# Patient Record
Sex: Female | Born: 1975 | Race: White | Hispanic: No | Marital: Married | Smoking: Never smoker
Health system: Southern US, Community
[De-identification: ages and names within clinical notes are randomized; demographics above are authoritative.]

## PROBLEM LIST (undated history)

## (undated) DIAGNOSIS — K219 Gastro-esophageal reflux disease without esophagitis: Secondary | ICD-10-CM

## (undated) DIAGNOSIS — J45909 Unspecified asthma, uncomplicated: Secondary | ICD-10-CM

## (undated) DIAGNOSIS — E282 Polycystic ovarian syndrome: Secondary | ICD-10-CM

## (undated) DIAGNOSIS — I10 Essential (primary) hypertension: Secondary | ICD-10-CM

## (undated) DIAGNOSIS — F3281 Premenstrual dysphoric disorder: Secondary | ICD-10-CM

## (undated) DIAGNOSIS — T7840XA Allergy, unspecified, initial encounter: Secondary | ICD-10-CM

## (undated) DIAGNOSIS — F419 Anxiety disorder, unspecified: Secondary | ICD-10-CM

## (undated) HISTORY — DX: Polycystic ovarian syndrome: E28.2

## (undated) HISTORY — DX: Allergy, unspecified, initial encounter: T78.40XA

## (undated) HISTORY — DX: Anxiety disorder, unspecified: F41.9

## (undated) HISTORY — DX: Premenstrual dysphoric disorder: F32.81

## (undated) HISTORY — DX: Unspecified asthma, uncomplicated: J45.909

## (undated) HISTORY — DX: Essential (primary) hypertension: I10

## (undated) HISTORY — DX: Gastro-esophageal reflux disease without esophagitis: K21.9

## (undated) HISTORY — PX: DENTAL SURGERY: SHX609

---

## 2015-10-11 DIAGNOSIS — E282 Polycystic ovarian syndrome: Secondary | ICD-10-CM | POA: Insufficient documentation

## 2015-10-11 DIAGNOSIS — F3281 Premenstrual dysphoric disorder: Secondary | ICD-10-CM | POA: Insufficient documentation

## 2015-10-14 LAB — HM PAP SMEAR

## 2017-07-22 ENCOUNTER — Ambulatory Visit
Admission: RE | Admit: 2017-07-22 | Discharge: 2017-07-22 | Disposition: A | Discharge status: Home / Self Care | Payer: BC Managed Care – PPO | Source: Ambulatory Visit | Attending: Unknown Physician Specialty | Admitting: Unknown Physician Specialty

## 2017-07-22 ENCOUNTER — Encounter: Payer: Self-pay | Admitting: Unknown Physician Specialty

## 2017-07-22 ENCOUNTER — Ambulatory Visit: Payer: BC Managed Care – PPO | Admitting: Unknown Physician Specialty

## 2017-07-22 VITALS — BP 148/97 | HR 90 | Temp 98.6°F | Ht 63.5 in | Wt 222.8 lb

## 2017-07-22 DIAGNOSIS — R05 Cough: Secondary | ICD-10-CM | POA: Insufficient documentation

## 2017-07-22 DIAGNOSIS — F3281 Premenstrual dysphoric disorder: Secondary | ICD-10-CM | POA: Diagnosis not present

## 2017-07-22 DIAGNOSIS — R059 Cough, unspecified: Secondary | ICD-10-CM

## 2017-07-22 DIAGNOSIS — J9811 Atelectasis: Secondary | ICD-10-CM | POA: Diagnosis not present

## 2017-07-22 DIAGNOSIS — J4521 Mild intermittent asthma with (acute) exacerbation: Secondary | ICD-10-CM

## 2017-07-22 DIAGNOSIS — J45909 Unspecified asthma, uncomplicated: Secondary | ICD-10-CM | POA: Insufficient documentation

## 2017-07-22 DIAGNOSIS — K219 Gastro-esophageal reflux disease without esophagitis: Secondary | ICD-10-CM | POA: Insufficient documentation

## 2017-07-22 MED ORDER — ALBUTEROL SULFATE (2.5 MG/3ML) 0.083% IN NEBU
2.5000 mg | INHALATION_SOLUTION | Freq: Once | RESPIRATORY_TRACT | Status: AC
  Administered 2017-07-22: 2.5 mg via RESPIRATORY_TRACT

## 2017-07-22 MED ORDER — ALBUTEROL SULFATE HFA 108 (90 BASE) MCG/ACT IN AERS: 2.0000 | INHALATION_SPRAY | Freq: Four times a day (QID) | RESPIRATORY_TRACT | 1 refills | Status: DC | PRN

## 2017-07-22 MED ORDER — MONTELUKAST SODIUM 10 MG PO TABS: 10.0000 mg | ORAL_TABLET | Freq: Every day | ORAL | 3 refills | Status: DC

## 2017-07-22 MED ORDER — ALBUTEROL SULFATE (2.5 MG/3ML) 0.083% IN NEBU: 2.5000 mg | INHALATION_SOLUTION | Freq: Four times a day (QID) | RESPIRATORY_TRACT | 1 refills | Status: DC | PRN

## 2017-07-22 MED ORDER — OMEPRAZOLE 20 MG PO CPDR: 20.0000 mg | DELAYED_RELEASE_CAPSULE | Freq: Every day | ORAL | 3 refills | Status: DC

## 2017-07-22 MED ORDER — FLUOXETINE HCL 20 MG PO CAPS: 20.0000 mg | ORAL_CAPSULE | Freq: Every day | ORAL | 3 refills | Status: DC

## 2017-07-22 NOTE — Progress Notes (Signed)
BP (!) 148/97 (BP Location: Left Arm, Cuff Size: Large)   Pulse 90   Temp 98.6 F (37 C) (Oral)   Ht 5' 3.5" (1.613 m)   Wt 222 lb 12.8 oz (101.1 kg)   LMP 07/20/2017 (Exact Date)   SpO2 99%   BMI 38.85 kg/m    Subjective:    [name], female    DOB: October 01, 1975, 42 y.o.   MRN: [id number]  HPI: Sydney White is a 42 y.o. female  Chief Complaint  Patient presents with  . Establish Care   Depression Taking Fluoxetine for PMDD.  Tried Celexa but caused nausea.   Depression screen [facility] 2/9 07/22/2017  Decreased Interest 0  Down, Depressed, Hopeless 0  PHQ - 2 Score 0  Altered sleeping 1  Tired, decreased energy 1  Change in appetite 1  Feeling bad or failure about yourself  0  Trouble concentrating 1  Moving slowly or fidgety/restless 0  PHQ-9 Score 4   Asthma History of frequent bronchitis as a child.  Seemed to reappear following pregnancy.  Singulair and Flonase helps "some."  States she has been feeling like she has a flare for the last 6 weeks.  States mucous draining in her throat makes her nauseated.    Cough  This is a new problem. The current episode started more than 1 month ago. The problem has been waxing and waning. The problem occurs constantly. The cough is productive of sputum. Associated symptoms include nasal congestion, rhinorrhea, a sore throat, shortness of breath and wheezing. Pertinent negatives include no chest pain, chills, ear congestion, ear pain, fever, headaches, heartburn, hemoptysis, myalgias, postnasal drip, rash, sweats or weight loss. Exacerbated by: activity. She has tried a beta-agonist inhaler for the symptoms. The treatment provided no relief. Her past medical history is significant for asthma.   Gerd Daily use of Omeprazole.  Needs it both days.  Almost everything gives her heart burn.    Obesity Often concerned about her weight.  Last diet was 2 years before son was born.    Social History   Socioeconomic  History  . Marital status: Married    Spouse name: Not on file  . Number of children: Not on file  . Years of education: Not on file  . Highest education level: Not on file  Social Needs  . Financial resource strain: Not on file  . Food insecurity - worry: Not on file  . Food insecurity - inability: Not on file  . Transportation needs - medical: Not on file  . Transportation needs - non-medical: Not on file  Occupational History  . Not on file  Tobacco Use  . Smoking status: Never Smoker  . Smokeless tobacco: Never Used  Substance and Sexual Activity  . Alcohol use: Yes    Comment: socially  . Drug use: No  . Sexual activity: No  Other Topics Concern  . Not on file  Social History Narrative  . Not on file   Family History  Problem Relation Age of Onset  . Hypertension Mother   . Arthritis Mother   . Asthma Mother   . Hypertension Sister   . Asthma Sister   . Arthritis Sister   . Hypertension Maternal Grandmother   . Kidney disease Maternal Grandmother   . GER disease Maternal Grandmother   . Heart disease Maternal Grandmother   . COPD Maternal Grandmother   . Alcohol abuse Maternal Grandfather   . Emphysema Maternal Grandfather   .  Arthritis Sister   . Asthma Sister     Past Medical History:  Diagnosis Date  . Allergy   . Asthma   . GERD (gastroesophageal reflux disease)   . PCOS (polycystic ovarian syndrome)   . PMDD (premenstrual dysphoric disorder)     Past Surgical History:  Procedure Laterality Date  . CESAREAN SECTION  01/04/2013    Relevant past medical, surgical, family and social history reviewed and updated as indicated. Interim medical history since our last visit reviewed. Allergies and medications reviewed and updated.  Review of Systems  Constitutional: Negative for chills, fever and weight loss.  HENT: Positive for rhinorrhea and sore throat. Negative for ear pain and postnasal drip.   Respiratory: Positive for cough, shortness of  breath and wheezing. Negative for hemoptysis.   Cardiovascular: Negative for chest pain.  Gastrointestinal: Negative for heartburn.  Musculoskeletal: Negative for myalgias.  Skin: Negative for rash.  Neurological: Negative for headaches.    Per HPI unless specifically indicated above     Objective:    BP (!) 148/97 (BP Location: Left Arm, Cuff Size: Large)   Pulse 90   Temp 98.6 F (37 C) (Oral)   Ht 5' 3.5" (1.613 m)   Wt 222 lb 12.8 oz (101.1 kg)   LMP 07/20/2017 (Exact Date)   SpO2 99%   BMI 38.85 kg/m   Wt Readings from Last 3 Encounters:  07/22/17 222 lb 12.8 oz (101.1 kg)    Physical Exam  Constitutional: She is oriented to person, place, and time. She appears well-developed and well-nourished. No distress.  HENT:  Head: Normocephalic and atraumatic.  Eyes: Conjunctivae and lids are normal. Right eye exhibits no discharge. Left eye exhibits no discharge. No scleral icterus.  Neck: Normal range of motion. Neck supple. No JVD present. Carotid bruit is not present.  Cardiovascular: Normal rate, regular rhythm and normal heart sounds.  Pulmonary/Chest: Effort normal. She has wheezes in the right upper field and the left upper field. She has rhonchi in the right lower field.  Abdominal: Normal appearance. There is no splenomegaly or hepatomegaly.  Musculoskeletal: Normal range of motion.  Neurological: She is alert and oriented to person, place, and time.  Skin: Skin is warm, dry and intact. No rash noted. No pallor.  Psychiatric: She has a normal mood and affect. Her behavior is normal. Judgment and thought content normal.    Results for orders placed or performed in visit on 07/22/17  HM PAP SMEAR  Result Value Ref Range   HM Pap smear      negative PAP- lab result in care everywhere from [place]:   Problem List Items Addressed This Visit      Unprioritized   Asthma    Worsening symptoms.  ? Viral related or worsening disease.          Relevant Medications   montelukast (SINGULAIR) 10 MG tablet   albuterol (PROVENTIL HFA;VENTOLIN HFA) 108 (90 Base) MCG/ACT inhaler   GERD (gastroesophageal reflux disease)    Stable, continue present medications.        Relevant Medications   omeprazole (PRILOSEC) 20 MG capsule   PMDD (premenstrual dysphoric disorder)    Stable, continue present medications.        Relevant Medications   FLUoxetine (PROZAC) 20 MG capsule    Other Visit Diagnoses    Cough    -  Primary   F/o atypical.  Rx for Zithromax.  Prednisone 40  mg daily for 5 days   Relevant Orders   DG Chest 2 View       Follow up plan: Return in about 1 day (around 07/23/2017).

## 2017-07-22 NOTE — Assessment & Plan Note (Signed)
Stable, continue present medications.   

## 2017-07-22 NOTE — Addendum Note (Signed)
Addended by: Georgina Peer on: 07/22/2017 04:49 PM   Modules accepted: Orders

## 2017-07-22 NOTE — Assessment & Plan Note (Signed)
Worsening symptoms.  ? Viral related or worsening disease.

## 2017-07-23 ENCOUNTER — Other Ambulatory Visit: Payer: Self-pay | Admitting: Unknown Physician Specialty

## 2017-07-23 ENCOUNTER — Encounter: Payer: Self-pay | Admitting: Unknown Physician Specialty

## 2017-07-23 ENCOUNTER — Ambulatory Visit: Payer: BC Managed Care – PPO | Admitting: Unknown Physician Specialty

## 2017-07-23 VITALS — BP 157/100 | HR 92 | Temp 98.5°F | Wt 222.5 lb

## 2017-07-23 DIAGNOSIS — J0100 Acute maxillary sinusitis, unspecified: Secondary | ICD-10-CM | POA: Diagnosis not present

## 2017-07-23 DIAGNOSIS — J4521 Mild intermittent asthma with (acute) exacerbation: Secondary | ICD-10-CM | POA: Diagnosis not present

## 2017-07-23 MED ORDER — PREDNISONE 20 MG PO TABS: 40.0000 mg | ORAL_TABLET | Freq: Every day | ORAL | 0 refills | Status: DC

## 2017-07-23 MED ORDER — AZITHROMYCIN 250 MG PO TABS: ORAL_TABLET | ORAL | 0 refills | Status: DC

## 2017-07-23 NOTE — Patient Instructions (Signed)
Atelectasis base of lungs.

## 2017-07-23 NOTE — Assessment & Plan Note (Signed)
Improving with present treatment.

## 2017-07-23 NOTE — Progress Notes (Signed)
   BP (!) 157/100   Pulse 92   Temp 98.5 F (36.9 C) (Oral)   Wt 222 lb 8 oz (100.9 kg)   LMP 07/20/2017 (Exact Date)   SpO2 98%   BMI 38.80 kg/m    Subjective:    [name], female    DOB: December 28, 1975, 42 y.o.   MRN: [id number]  HPI: Sydney White is a 42 y.o. female  Chief Complaint  Patient presents with  . Paperwork   Plus f/u on asthma.  States she coughed up a lot of mucous last night and slept better.   She has not started the Prednisone or the Zpack yet.  Chest x-ray showed atelectases but no pneumonia.    Relevant past medical, surgical, family and social history reviewed and updated as indicated. Interim medical history since our last visit reviewed. Allergies and medications reviewed and updated.  Review of Systems  Per HPI unless specifically indicated above     Objective:    BP (!) 157/100   Pulse 92   Temp 98.5 F (36.9 C) (Oral)   Wt 222 lb 8 oz (100.9 kg)   LMP 07/20/2017 (Exact Date)   SpO2 98%   BMI 38.80 kg/m   Wt Readings from Last 3 Encounters:  07/23/17 222 lb 8 oz (100.9 kg)  07/22/17 222 lb 12.8 oz (101.1 kg)    Physical Exam  Constitutional: She is oriented to person, place, and time. She appears well-developed and well-nourished. No distress.  Eyes: Conjunctivae and lids are normal. Right eye exhibits no discharge. Left eye exhibits no discharge. No scleral icterus.  Neck: Normal range of motion. Neck supple. No JVD present. Carotid bruit is not present.  Cardiovascular: Normal rate, regular rhythm and normal heart sounds.  Pulmonary/Chest: Effort normal and breath sounds normal.  Abdominal: Normal appearance. There is no splenomegaly or hepatomegaly.  Musculoskeletal: Normal range of motion.  Neurological: She is alert and oriented to person, place, and time.  Skin: Skin is warm, dry and intact. No rash noted. No pallor.  Psychiatric: She has a normal mood and affect. Her behavior is normal. Judgment and thought  content normal.    Results for orders placed or performed in visit on 07/22/17  HM PAP SMEAR  Result Value Ref Range   HM Pap smear      negative PAP- lab result in care everywhere from [place]:   Problem List Items Addressed This Visit      Unprioritized   Asthma    Improving with present treatment.         Other Visit Diagnoses    Acute non-recurrent maxillary sinusitis    -  Primary   Rx Z pack written.  Hold Prednisone unless starting wheezing.        Form filled out for school  Follow up plan: Return in about 6 weeks (around 09/03/2017). top assess Asthma

## 2017-09-03 ENCOUNTER — Encounter: Payer: Self-pay | Admitting: Unknown Physician Specialty

## 2017-09-03 ENCOUNTER — Ambulatory Visit: Payer: BC Managed Care – PPO | Admitting: Unknown Physician Specialty

## 2017-09-03 VITALS — BP 118/86 | HR 109 | Temp 98.9°F | Ht 63.5 in | Wt 227.4 lb

## 2017-09-03 DIAGNOSIS — J4521 Mild intermittent asthma with (acute) exacerbation: Secondary | ICD-10-CM

## 2017-09-03 DIAGNOSIS — J029 Acute pharyngitis, unspecified: Secondary | ICD-10-CM

## 2017-09-03 DIAGNOSIS — J020 Streptococcal pharyngitis: Secondary | ICD-10-CM

## 2017-09-03 LAB — RAPID STREP SCREEN (MED CTR MEBANE ONLY): STREP GP A AG, IA W/REFLEX: POSITIVE — AB

## 2017-09-03 MED ORDER — BUDESONIDE-FORMOTEROL FUMARATE 160-4.5 MCG/ACT IN AERO: 2.0000 | INHALATION_SPRAY | Freq: Two times a day (BID) | RESPIRATORY_TRACT | 3 refills | Status: DC

## 2017-09-03 MED ORDER — AMOXICILLIN 875 MG PO TABS: 875.0000 mg | ORAL_TABLET | Freq: Two times a day (BID) | ORAL | 0 refills | Status: DC

## 2017-09-03 NOTE — Progress Notes (Signed)
BP 118/86   Pulse (!) 109   Temp 98.9 F (37.2 C) (Oral)   Ht 5' 3.5" (1.613 m)   Wt 227 lb 6.4 oz (103.1 kg)   SpO2 98%   BMI 39.65 kg/m    Subjective:    [name], female    DOB: 09/04/1975, 42 y.o.   MRN: [id number]  HPI: Sydney White is a 42 y.o. female  Chief Complaint  Patient presents with  . URI    pt states she has had congestion, fever, sinus pressure and sore throat since Sunday   . Sore Throat   Sore Throat   This is a new problem. Episode onset: 2 days. The problem has been unchanged. The maximum temperature recorded prior to her arrival was 100.4 - 100.9 F. Associated symptoms include congestion, coughing and drooling. Pertinent negatives include no abdominal pain, diarrhea, ear discharge, ear pain, headaches, hoarse voice, plugged ear sensation, neck pain, shortness of breath, stridor, swollen glands, trouble swallowing or vomiting. Associated symptoms comments: Body aches . She has tried acetaminophen and NSAIDs for the symptoms.   Asthma Feeling well for the past 3 weeks.  Taking Albuterol 2 puffs BID every day.    Relevant past medical, surgical, family and social history reviewed and updated as indicated. Interim medical history since our last visit reviewed. Allergies and medications reviewed and updated.  Review of Systems  HENT: Positive for congestion and drooling. Negative for ear discharge, ear pain, hoarse voice and trouble swallowing.   Respiratory: Positive for cough. Negative for shortness of breath and stridor.   Gastrointestinal: Negative for abdominal pain, diarrhea and vomiting.  Musculoskeletal: Negative for neck pain.  Neurological: Negative for headaches.    Per HPI unless specifically indicated above     Objective:    BP 118/86   Pulse (!) 109   Temp 98.9 F (37.2 C) (Oral)   Ht 5' 3.5" (1.613 m)   Wt 227 lb 6.4 oz (103.1 kg)   SpO2 98%   BMI 39.65 kg/m   Wt Readings from Last 3 Encounters:    09/03/17 227 lb 6.4 oz (103.1 kg)  07/23/17 222 lb 8 oz (100.9 kg)  07/22/17 222 lb 12.8 oz (101.1 kg)    Physical Exam  Constitutional: She is oriented to person, place, and time. She appears well-developed and well-nourished. No distress.  HENT:  Head: Normocephalic and atraumatic.  Right Ear: Tympanic membrane and ear canal normal.  Left Ear: Tympanic membrane and ear canal normal.  Nose: Rhinorrhea present. Right sinus exhibits no maxillary sinus tenderness and no frontal sinus tenderness. Left sinus exhibits no maxillary sinus tenderness and no frontal sinus tenderness.  Mouth/Throat: Mucous membranes are normal. Posterior oropharyngeal erythema present.  Eyes: Conjunctivae and lids are normal. Right eye exhibits no discharge. Left eye exhibits no discharge. No scleral icterus.  Cardiovascular: Normal rate and regular rhythm.  Pulmonary/Chest: Effort normal. No respiratory distress. She has wheezes.  Abdominal: Normal appearance. There is no splenomegaly or hepatomegaly.  Musculoskeletal: Normal range of motion.  Lymphadenopathy:    She has cervical adenopathy.  Neurological: She is alert and oriented to person, place, and time.  Skin: Skin is intact. No rash noted. No pallor.  Psychiatric: She has a normal mood and affect. Her behavior is normal. Judgment and thought content normal.    Results for orders placed or performed in visit on 07/22/17  HM PAP SMEAR  Result Value Ref Range   HM Pap smear  negative PAP- lab result in care everywhere from [place]:   Problem List Items Addressed This Visit      Unprioritized   Asthma    Doing well but taking Albuterol BID.  Start Symbicort 2 puffs BID.  Recheck back in 6 weeks.        Relevant Medications   budesonide-formoterol (SYMBICORT) 160-4.5 MCG/ACT inhaler    Other Visit Diagnoses    Sore throat    -  Primary   Relevant Orders   Rapid Strep Screen (Not at [facility])   Strep pharyngitis        Will start Amoxil 875 mg BID.  NSAIDS.  Salt water gargles       Follow up plan: Return in about 6 weeks (around 10/15/2017).

## 2017-09-03 NOTE — Patient Instructions (Signed)
Strep Throat Strep throat is a bacterial infection of the throat. Your health care provider may call the infection tonsillitis or pharyngitis, depending on whether there is swelling in the tonsils or at the back of the throat. Strep throat is most common during the cold months of the year in children who are 5-40 years of age, but it can happen during any season in people of any age. This infection is spread from person to person (contagious) through coughing, sneezing, or close contact. What are the causes? Strep throat is caused by the bacteria called Streptococcus pyogenes. What increases the risk? This condition is more likely to develop in:  People who spend time in crowded places where the infection can spread easily.  People who have close contact with someone who has strep throat.  What are the signs or symptoms? Symptoms of this condition include:  Fever or chills.  Redness, swelling, or pain in the tonsils or throat.  Pain or difficulty when swallowing.  White or yellow spots on the tonsils or throat.  Swollen, tender glands in the neck or under the jaw.  Red rash all over the body (rare).  How is this diagnosed? This condition is diagnosed by performing a rapid strep test or by taking a swab of your throat (throat culture test). Results from a rapid strep test are usually ready in a few minutes, but throat culture test results are available after one or two days. How is this treated? This condition is treated with antibiotic medicine. Follow these instructions at home: Medicines  Take over-the-counter and prescription medicines only as told by your health care provider.  Take your antibiotic as told by your health care provider. Do not stop taking the antibiotic even if you start to feel better.  Have family members who also have a sore throat or fever tested for strep throat. They may need antibiotics if they have the strep infection. Eating and drinking  Do not  share food, drinking cups, or personal items that could cause the infection to spread to other people.  If swallowing is difficult, try eating soft foods until your sore throat feels better.  Drink enough fluid to keep your urine clear or pale yellow. General instructions  Gargle with a salt-water mixture 3-4 times per day or as needed. To make a salt-water mixture, completely dissolve -1 tsp of salt in 1 cup of warm water.  Make sure that all household members wash their hands well.  Get plenty of rest.  Stay home from school or work until you have been taking antibiotics for 24 hours.  Keep all follow-up visits as told by your health care provider. This is important. Contact a health care provider if:  The glands in your neck continue to get bigger.  You develop a rash, cough, or earache.  You cough up a thick liquid that is green, yellow-brown, or bloody.  You have pain or discomfort that does not get better with medicine.  Your problems seem to be getting worse rather than better.  You have a fever. Get help right away if:  You have new symptoms, such as vomiting, severe headache, stiff or painful neck, chest pain, or shortness of breath.  You have severe throat pain, drooling, or changes in your voice.  You have swelling of the neck, or the skin on the neck becomes red and tender.  You have signs of dehydration, such as fatigue, dry mouth, and decreased urination.  You become increasingly sleepy, or   you cannot wake up completely.  Your joints become red or painful. This information is not intended to replace advice given to you by your health care provider. Make sure you discuss any questions you have with your health care provider. Document Released: 05/18/2000 Document Revised: 01/18/2016 Document Reviewed: 09/13/2014 Elsevier Interactive Patient Education  2018 [employer] Inc.  

## 2017-09-03 NOTE — Assessment & Plan Note (Signed)
Doing well but taking Albuterol BID.  Start Symbicort 2 puffs BID.  Recheck back in 6 weeks.

## 2017-10-02 ENCOUNTER — Ambulatory Visit (INDEPENDENT_AMBULATORY_CARE_PROVIDER_SITE_OTHER): Payer: BC Managed Care – PPO | Admitting: Unknown Physician Specialty

## 2017-10-02 ENCOUNTER — Encounter: Payer: Self-pay | Admitting: Unknown Physician Specialty

## 2017-10-02 DIAGNOSIS — J301 Allergic rhinitis due to pollen: Secondary | ICD-10-CM

## 2017-10-02 DIAGNOSIS — J4521 Mild intermittent asthma with (acute) exacerbation: Secondary | ICD-10-CM | POA: Diagnosis not present

## 2017-10-02 DIAGNOSIS — J309 Allergic rhinitis, unspecified: Secondary | ICD-10-CM | POA: Insufficient documentation

## 2017-10-02 MED ORDER — PREDNISONE 10 MG PO TABS: ORAL_TABLET | ORAL | 0 refills | Status: DC

## 2017-10-02 NOTE — Assessment & Plan Note (Signed)
Pt having a flare in the last 3 days, presumably due to pollen exposure.  Will rx predisone taper.  Continue Singulair.  OK to take Zyrtec BID during a bad flare

## 2017-10-02 NOTE — Progress Notes (Signed)
BP (!) 136/95   Pulse 87   Temp 99.5 F (37.5 C) (Oral)   Ht 5' 3.5" (1.613 m)   Wt 226 lb 12.8 oz (102.9 kg)   SpO2 97%   BMI 39.55 kg/m    Subjective:    [name], female    DOB: [birth year], 42 y.o.   MRN: [id number]  HPI: Sydney White is a 42 y.o. female  Chief Complaint  Patient presents with  . Cough    pt states she has had a cough and a tickle in her throat for the past 3 days  . Nausea    pt states the symbicort is making her nauseated and sick   Cough Pt with persistent cough.  Describes it as a "tickle" in the back of her throat.  States the only thing that helped is [place] D.  However, is keeping her up at night.  Nauseated with her inhalers.  Not taking Symbicort due to nausea.  Rare use of rescue inhaler  She has been to an allergist before and immunotherapy was recommended.    Relevant past medical, surgical, family and social history reviewed and updated as indicated. Interim medical history since our last visit reviewed. Allergies and medications reviewed and updated.  Review of Systems  Per HPI unless specifically indicated above     Objective:    BP (!) 136/95   Pulse 87   Temp 99.5 F (37.5 C) (Oral)   Ht 5' 3.5" (1.613 m)   Wt 226 lb 12.8 oz (102.9 kg)   SpO2 97%   BMI 39.55 kg/m   Wt Readings from Last 3 Encounters:  10/02/17 226 lb 12.8 oz (102.9 kg)  09/03/17 227 lb 6.4 oz (103.1 kg)  07/23/17 222 lb 8 oz (100.9 kg)    Physical Exam  Constitutional: She is oriented to person, place, and time. She appears well-developed and well-nourished. No distress.  HENT:  Head: Normocephalic and atraumatic.  Right Ear: Tympanic membrane and ear canal normal.  Left Ear: Tympanic membrane and ear canal normal.  Nose: Rhinorrhea present. Right sinus exhibits no maxillary sinus tenderness and no frontal sinus tenderness. Left sinus exhibits no maxillary sinus tenderness and no frontal sinus tenderness.  Mouth/Throat:  Mucous membranes are normal. Posterior oropharyngeal erythema present.  Eyes: Conjunctivae and lids are normal. Right eye exhibits no discharge. Left eye exhibits no discharge. No scleral icterus.  Cardiovascular: Normal rate and regular rhythm.  Pulmonary/Chest: Effort normal and breath sounds normal. No respiratory distress.  Abdominal: Normal appearance. There is no splenomegaly or hepatomegaly.  Musculoskeletal: Normal range of motion.  Neurological: She is alert and oriented to person, place, and time.  Skin: Skin is intact. No rash noted. No pallor.  Psychiatric: She has a normal mood and affect. Her behavior is normal. Judgment and thought content normal.      Assessment & Plan:   Problem List Items Addressed This Visit      Unprioritized   Allergic rhinitis    Pt having a flare in the last 3 days, presumably due to pollen exposure.  Will rx predisone taper.  Continue Singulair.  OK to take Zyrtec BID during a bad flare      Asthma    Stable without Symbicort which she seems to be intolerant to.  Continue with Albuterol prn and Singulair      Relevant Medications   predniSONE (DELTASONE) 10 MG tablet       Follow up plan: Return in  about 2 weeks (around 10/16/2017).

## 2017-10-02 NOTE — Assessment & Plan Note (Signed)
Stable without Symbicort which she seems to be intolerant to.  Continue with Albuterol prn and Singulair

## 2017-10-16 ENCOUNTER — Ambulatory Visit: Payer: BC Managed Care – PPO | Admitting: Unknown Physician Specialty

## 2017-10-17 ENCOUNTER — Encounter: Payer: Self-pay | Admitting: Unknown Physician Specialty

## 2018-02-04 ENCOUNTER — Other Ambulatory Visit: Payer: Self-pay | Admitting: Unknown Physician Specialty

## 2018-02-05 NOTE — Telephone Encounter (Signed)
Fluoxetine 20 mg refill Last Refill:ordered: 07/22/17 # 90 1 RF Last OV: 07/22/17- PMDD, 10/02/17 acute PCP: Julian Hy Pharmacy:[facility]  Patient is [facility] patient and has not established with another provider- she may need to be scheduled for follow up with someone before she runs out of medication

## 2018-02-05 NOTE — Telephone Encounter (Signed)
Will give her 30 days, have her see new PCP for more refills.

## 2018-04-29 ENCOUNTER — Encounter: Payer: Self-pay | Admitting: Nurse Practitioner

## 2018-04-30 ENCOUNTER — Ambulatory Visit (INDEPENDENT_AMBULATORY_CARE_PROVIDER_SITE_OTHER): Payer: BC Managed Care – PPO | Admitting: Nurse Practitioner

## 2018-04-30 ENCOUNTER — Encounter: Payer: Self-pay | Admitting: Nurse Practitioner

## 2018-04-30 ENCOUNTER — Other Ambulatory Visit: Payer: Self-pay

## 2018-04-30 VITALS — BP 130/86 | HR 80 | Temp 98.4°F | Ht 62.5 in | Wt 216.0 lb

## 2018-04-30 DIAGNOSIS — F3281 Premenstrual dysphoric disorder: Secondary | ICD-10-CM

## 2018-04-30 DIAGNOSIS — K219 Gastro-esophageal reflux disease without esophagitis: Secondary | ICD-10-CM

## 2018-04-30 DIAGNOSIS — Z0000 Encounter for general adult medical examination without abnormal findings: Secondary | ICD-10-CM | POA: Diagnosis not present

## 2018-04-30 DIAGNOSIS — J4521 Mild intermittent asthma with (acute) exacerbation: Secondary | ICD-10-CM

## 2018-04-30 DIAGNOSIS — J301 Allergic rhinitis due to pollen: Secondary | ICD-10-CM

## 2018-04-30 MED ORDER — MONTELUKAST SODIUM 10 MG PO TABS: 10.0000 mg | ORAL_TABLET | Freq: Every day | ORAL | 3 refills | Status: DC

## 2018-04-30 MED ORDER — FLUOXETINE HCL 20 MG PO CAPS: 20.0000 mg | ORAL_CAPSULE | Freq: Every day | ORAL | 7 refills | Status: DC

## 2018-04-30 MED ORDER — OMEPRAZOLE 20 MG PO CPDR: 20.0000 mg | DELAYED_RELEASE_CAPSULE | Freq: Every day | ORAL | 3 refills | Status: DC

## 2018-04-30 NOTE — Assessment & Plan Note (Signed)
Chronic, stable.  Continue current regimen.  Mag level today, monitor yearly.

## 2018-04-30 NOTE — Progress Notes (Signed)
BP 130/86   Pulse 80   Temp 98.4 F (36.9 C) (Oral)   Ht 5' 2.5" (1.588 m)   Wt 216 lb (98 kg)   SpO2 99%   BMI 38.88 kg/m    Subjective:    [name], female    DOB: Oct 26, 1975, 42 y.o.   MRN: [id number]  HPI: Sydney White is a 42 y.o. female who presents for annual physical  Chief Complaint  Patient presents with  . Annual Exam   ASTHMA AND ALLERGIC RHINITIS Asthma status: stable Satisfied with current treatment?: yes Albuterol/rescue inhaler frequency: rarely uses Dyspnea frequency:  Wheezing frequency: Cough frequency:  Nocturnal symptom frequency:  Limitation of activity: no Current upper respiratory symptoms: no Triggers:  Home peak flows: Last Spirometry:  Failed/intolerant to following asthma meds:  Asthma meds in past:  Aerochamber/spacer use: no Visits to ER or Urgent Care in past year: no Pneumovax: unknown Influenza: Up to Date  GERD GERD control status: stable  Satisfied with current treatment? yes Heartburn frequency:  Medication side effects: no  Medication compliance: better Previous GERD medications: Antacid use frequency:   Duration:  Nature:  Location:  Heartburn duration:  Alleviatiating factors:   Aggravating factors:  Dysphagia: no Odynophagia:  no Hematemesis: no Blood in stool: no EGD: no   PMDD: She reports good control on Prozac at this time.    GAD 7 : Generalized Anxiety Score 04/30/2018  Nervous, Anxious, on Edge 2  Control/stop worrying 0  Worry too much - different things 0  Trouble relaxing 1  Restless 0  Easily annoyed or irritable 1  Afraid - awful might happen 1  Total GAD 7 Score 5  Anxiety Difficulty Not difficult at all   Depression screen [facility] 2/9 04/30/2018 07/22/2017  Decreased Interest 0 0  Down, Depressed, Hopeless 1 0  PHQ - 2 Score 1 0  Altered sleeping 0 1  Tired, decreased energy 1 1  Change in appetite 2 1  Feeling bad or failure about yourself  1 0  Trouble  concentrating 0 1  Moving slowly or fidgety/restless 0 0  Suicidal thoughts 1 -  PHQ-9 Score 6 4  Difficult doing work/chores Not difficult at all -   Functional Status Survey: Is the patient deaf or have difficulty hearing?: No Does the patient have difficulty seeing, even when wearing glasses/contacts?: No Does the patient have difficulty concentrating, remembering, or making decisions?: No Does the patient have difficulty walking or climbing stairs?: No Does the patient have difficulty dressing or bathing?: No Does the patient have difficulty doing errands alone such as visiting a doctor's office or shopping?: No   Social History   Socioeconomic History  . Marital status: Married    Spouse name: Not on file  . Number of children: Not on file  . Years of education: Not on file  . Highest education level: Not on file  Occupational History  . Not on file  Social Needs  . Financial resource strain: Not on file  . Food insecurity:    Worry: Not on file    Inability: Not on file  . Transportation needs:    Medical: Not on file    Non-medical: Not on file  Tobacco Use  . Smoking status: Never Smoker  . Smokeless tobacco: Never Used  Substance and Sexual Activity  . Alcohol use: Yes    Comment: socially  . Drug use: No  . Sexual activity: Never  Lifestyle  . Physical activity:  Days per week: Not on file    Minutes per session: Not on file  . Stress: Not on file  Relationships  . Social connections:    Talks on phone: Not on file    Gets together: Not on file    Attends religious service: Not on file    Active member of club or organization: Not on file    Attends meetings of clubs or organizations: Not on file    Relationship status: Not on file  . Intimate partner violence:    Fear of current or ex partner: Not on file    Emotionally abused: Not on file    Physically abused: Not on file    Forced sexual activity: Not on file  Other Topics Concern  . Not on  file  Social History Narrative  . Not on file    Relevant past medical, surgical, family and social history reviewed and updated as indicated. Interim medical history since our last visit reviewed. Allergies and medications reviewed and updated.  Review of Systems  Constitutional: Negative for activity change, appetite change, diaphoresis, fatigue and fever.  HENT: Negative.   Eyes: Negative.   Respiratory: Negative for cough, chest tightness, shortness of breath and wheezing.   Cardiovascular: Negative for chest pain, palpitations and leg swelling.  Gastrointestinal: Negative for abdominal distention, abdominal pain, constipation, diarrhea, nausea and vomiting.  Endocrine: Negative for cold intolerance, heat intolerance, polydipsia, polyphagia and polyuria.  Genitourinary: Negative.   Musculoskeletal: Negative.   Skin: Negative.   Allergic/Immunologic: Negative.   Neurological: Negative for dizziness, syncope, weakness, light-headedness, numbness and headaches.  Hematological: Negative.   Psychiatric/Behavioral: Negative for behavioral problems, confusion, decreased concentration, sleep disturbance and suicidal ideas. The patient is not nervous/anxious.     Per HPI unless specifically indicated above     Objective:    BP 130/86   Pulse 80   Temp 98.4 F (36.9 C) (Oral)   Ht 5' 2.5" (1.588 m)   Wt 216 lb (98 kg)   SpO2 99%   BMI 38.88 kg/m   Wt Readings from Last 3 Encounters:  04/30/18 216 lb (98 kg)  10/02/17 226 lb 12.8 oz (102.9 kg)  09/03/17 227 lb 6.4 oz (103.1 kg)    Physical Exam  Constitutional: She is oriented to person, place, and time. She appears well-developed and well-nourished.  HENT:  Head: Normocephalic and atraumatic.  Right Ear: Hearing, tympanic membrane, external ear and ear canal normal.  Left Ear: Hearing, tympanic membrane, external ear and ear canal normal.  Nose: Nose normal. Right sinus exhibits no maxillary sinus tenderness and no frontal  sinus tenderness. Left sinus exhibits no maxillary sinus tenderness and no frontal sinus tenderness.  Mouth/Throat: Oropharynx is clear and moist.  Eyes: Pupils are equal, round, and reactive to light. Conjunctivae and EOM are normal. Right eye exhibits no discharge. Left eye exhibits no discharge.  Neck: Normal range of motion. Neck supple. No JVD present. Carotid bruit is not present. No thyromegaly present.  Cardiovascular: Normal rate, regular rhythm, normal heart sounds and intact distal pulses.  Pulmonary/Chest: Effort normal and breath sounds normal. Right breast exhibits no inverted nipple, no mass, no nipple discharge, no skin change and no tenderness. Left breast exhibits no inverted nipple, no mass, no nipple discharge, no skin change and no tenderness.  Abdominal: Soft. Bowel sounds are normal. There is no splenomegaly or hepatomegaly.  Musculoskeletal: Normal range of motion.  Lymphadenopathy:    She has no cervical adenopathy.  Neurological:  She is alert and oriented to person, place, and time. She has normal reflexes.  Reflex Scores:      Brachioradialis reflexes are 2+ on the right side and 2+ on the left side.      Patellar reflexes are 2+ on the right side and 2+ on the left side. Skin: Skin is warm and dry.  Psychiatric: She has a normal mood and affect. Her behavior is normal.  Nursing note and vitals reviewed.   Results for orders placed or performed in visit on 09/03/17  Rapid Strep Screen (Not at [facility])  Result Value Ref Range   Strep Gp A Ag, IA W/Reflex Positive (A) Negative      Assessment & Plan:   Problem List Items Addressed This Visit      Respiratory   Asthma    Chronic, stable without maintenance medications.  Continue PRN Albuterol (which she uses minimally) and continue Singulair.      Relevant Medications   montelukast (SINGULAIR) 10 MG tablet   Other Relevant Orders   CBC with Differential/Platelet   Allergic rhinitis    Chronic, stable.   Continue Zyrtec and Flonase.        Digestive   GERD (gastroesophageal reflux disease)    Chronic, stable.  Continue current regimen.  Mag level today, monitor yearly.      Relevant Medications   omeprazole (PRILOSEC) 20 MG capsule   Other Relevant Orders   Magnesium     Other   PMDD (premenstrual dysphoric disorder)    Chronic, stable.  Continue Prozac.      Relevant Medications   FLUoxetine (PROZAC) 20 MG capsule    Other Visit Diagnoses    Annual physical exam    -  Primary   Relevant Orders   TSH   Comprehensive metabolic panel   Lipid Panel w/o Chol/HDL Ratio   MM DIGITAL SCREENING BILATERAL       Follow up plan: Return in about 6 months (around 10/29/2018) for Asthma and GERD.

## 2018-04-30 NOTE — Assessment & Plan Note (Signed)
Chronic, stable.  Continue Prozac.

## 2018-04-30 NOTE — Patient Instructions (Signed)
Food Choices for Gastroesophageal Reflux Disease, Adult When you have gastroesophageal reflux disease (GERD), the foods you eat and your eating habits are very important. Choosing the right foods can help ease your discomfort. What guidelines do I need to follow?  Choose fruits, vegetables, whole grains, and low-fat dairy products.  Choose low-fat meat, fish, and poultry.  Limit fats such as oils, salad dressings, butter, nuts, and avocado.  Keep a food diary. This helps you identify foods that cause symptoms.  Avoid foods that cause symptoms. These may be different for everyone.  Eat small meals often instead of 3 large meals a day.  Eat your meals slowly, in a place where you are relaxed.  Limit fried foods.  Cook foods using methods other than frying.  Avoid drinking alcohol.  Avoid drinking large amounts of liquids with your meals.  Avoid bending over or lying down until 2-3 hours after eating. What foods are not recommended? These are some foods and drinks that may make your symptoms worse: Vegetables  Tomatoes. Tomato juice. Tomato and spaghetti sauce. Chili peppers. Onion and garlic. Horseradish. Fruits  Oranges, grapefruit, and lemon (fruit and juice). Meats  High-fat meats, fish, and poultry. This includes hot dogs, ribs, ham, sausage, salami, and bacon. Dairy  Whole milk and chocolate milk. Sour cream. Cream. Butter. Ice cream. Cream cheese. Drinks  Coffee and tea. Bubbly (carbonated) drinks or energy drinks. Condiments  Hot sauce. Barbecue sauce. Sweets/Desserts  Chocolate and cocoa. Donuts. Peppermint and spearmint. Fats and Oils  High-fat foods. This includes French fries and potato chips. Other  Vinegar. Strong spices. This includes black pepper, white pepper, red pepper, cayenne, curry powder, cloves, ginger, and chili powder. The items listed above may not be a complete list of foods and drinks to avoid. Contact your dietitian for more information.    This information is not intended to replace advice given to you by your health care provider. Make sure you discuss any questions you have with your health care provider. Document Released: 11/20/2011 Document Revised: 10/27/2015 Document Reviewed: 03/25/2013 Elsevier Interactive Patient Education  2017 Elsevier Inc.  

## 2018-04-30 NOTE — Assessment & Plan Note (Signed)
Chronic, stable.  Continue Zyrtec and Flonase.

## 2018-04-30 NOTE — Assessment & Plan Note (Signed)
Chronic, stable without maintenance medications.  Continue PRN Albuterol (which she uses minimally) and continue Singulair.

## 2018-05-01 ENCOUNTER — Other Ambulatory Visit: Payer: Self-pay | Admitting: Nurse Practitioner

## 2018-05-01 DIAGNOSIS — D509 Iron deficiency anemia, unspecified: Secondary | ICD-10-CM

## 2018-05-01 DIAGNOSIS — D649 Anemia, unspecified: Secondary | ICD-10-CM | POA: Insufficient documentation

## 2018-05-01 LAB — CBC WITH DIFFERENTIAL/PLATELET
BASOS: 2 %
Basophils Absolute: 0.1 10*3/uL (ref 0.0–0.2)
EOS (ABSOLUTE): 0.1 10*3/uL (ref 0.0–0.4)
Eos: 2 %
Hematocrit: 31.2 % — ABNORMAL LOW (ref 34.0–46.6)
Hemoglobin: 9.3 g/dL — ABNORMAL LOW (ref 11.1–15.9)
Immature Grans (Abs): 0 10*3/uL (ref 0.0–0.1)
Immature Granulocytes: 0 %
Lymphocytes Absolute: 1.9 10*3/uL (ref 0.7–3.1)
Lymphs: 31 %
MCH: 21.2 pg — AB (ref 26.6–33.0)
MCHC: 29.8 g/dL — AB (ref 31.5–35.7)
MCV: 71 fL — AB (ref 79–97)
MONOS ABS: 0.4 10*3/uL (ref 0.1–0.9)
Monocytes: 7 %
NEUTROS ABS: 3.7 10*3/uL (ref 1.4–7.0)
NEUTROS PCT: 58 %
PLATELETS: 465 10*3/uL — AB (ref 150–450)
RBC: 4.39 x10E6/uL (ref 3.77–5.28)
RDW: 14.9 % (ref 12.3–15.4)
WBC: 6.3 10*3/uL (ref 3.4–10.8)

## 2018-05-01 LAB — COMPREHENSIVE METABOLIC PANEL
A/G RATIO: 1.7 (ref 1.2–2.2)
ALT: 21 IU/L (ref 0–32)
AST: 18 IU/L (ref 0–40)
Albumin: 4.5 g/dL (ref 3.5–5.5)
Alkaline Phosphatase: 100 IU/L (ref 39–117)
BUN/Creatinine Ratio: 9 (ref 9–23)
BUN: 8 mg/dL (ref 6–24)
Bilirubin Total: 0.5 mg/dL (ref 0.0–1.2)
CALCIUM: 9.6 mg/dL (ref 8.7–10.2)
CO2: 22 mmol/L (ref 20–29)
Chloride: 98 mmol/L (ref 96–106)
Creatinine, Ser: 0.89 mg/dL (ref 0.57–1.00)
GFR, EST AFRICAN AMERICAN: 94 mL/min/{1.73_m2} (ref >59)
GFR, EST NON AFRICAN AMERICAN: 81 mL/min/{1.73_m2} (ref >59)
GLOBULIN, TOTAL: 2.7 g/dL (ref 1.5–4.5)
Glucose: 80 mg/dL (ref 65–99)
POTASSIUM: 4.1 mmol/L (ref 3.5–5.2)
SODIUM: 136 mmol/L (ref 134–144)
Total Protein: 7.2 g/dL (ref 6.0–8.5)

## 2018-05-01 LAB — LIPID PANEL W/O CHOL/HDL RATIO
Cholesterol, Total: 181 mg/dL (ref 100–199)
HDL: 72 mg/dL (ref >39)
LDL Calculated: 95 mg/dL (ref 0–99)
Triglycerides: 68 mg/dL (ref 0–149)
VLDL Cholesterol Cal: 14 mg/dL (ref 5–40)

## 2018-05-01 LAB — TSH: TSH: 1.17 u[IU]/mL (ref 0.450–4.500)

## 2018-05-01 LAB — MAGNESIUM: Magnesium: 2.1 mg/dL (ref 1.6–2.3)

## 2018-05-01 NOTE — Progress Notes (Signed)
Sent message via MyChart to patient to alert her to physical lab results.  H/H 9.3/31.2 with MCV 71 and PLT 465, on review of previous labs noted labs from [facility] drawn in August 2019 with H/H 10.0/33.4, MCV 77.3, PLT 412.  Instructed patient to initiate Ferrous Sulfate 325 MG once a day orally, along with Ascorbic Acid 500 MG or a cup of orange juice (to enhance absorption).  She is to come in next week for lab draw only, anemia panel, and return in 6 weeks for visit with provider to recheck labs and assessment.

## 2018-06-11 ENCOUNTER — Ambulatory Visit: Payer: Self-pay | Admitting: *Deleted

## 2018-06-11 NOTE — Telephone Encounter (Signed)
Attempted to call pt x 2 but no answer and no return call.

## 2018-06-11 NOTE — Telephone Encounter (Signed)
Attempted to call patient. No answer. Mail box is full.

## 2018-06-11 NOTE — Telephone Encounter (Signed)
Summary: Medication question    Patient has not taken her FLUoxetine (PROZAC) 20 MG capsule for 4 days as it was lost but she found it and needs to know if it is okay for her to start taking again? Please advise  Best call back is [phone]     Attempted to call patient- mailbox is full- unable to leave message

## 2018-06-12 NOTE — Telephone Encounter (Signed)
Pt aware.

## 2018-06-12 NOTE — Telephone Encounter (Signed)
Yes, it is okay for patient to start taking again.  Please make her aware.

## 2018-08-12 ENCOUNTER — Other Ambulatory Visit: Payer: Self-pay | Admitting: Unknown Physician Specialty

## 2019-01-26 ENCOUNTER — Other Ambulatory Visit: Payer: Self-pay | Admitting: Nurse Practitioner

## 2019-01-26 NOTE — Telephone Encounter (Signed)
Upcoming appointment 02/06/19

## 2019-01-26 NOTE — Telephone Encounter (Signed)
Requested medication (s) are due for refill today: yes  Requested medication (s) are on the active medication list: yes  Last refill:  04/30/2018  Future visit scheduled: no  Notes to clinic: Spoke with patient and advised her to call the office to schedule a follow up appointment. Unable to refill medication per protocol.   Requested Prescriptions  Pending Prescriptions Disp Refills   FLUoxetine (PROZAC) 20 MG capsule [Pharmacy Med Name: FLUOXETINE 20MG  CAPSULES] 30 capsule 7    Sig: TAKE 1 CAPSULE BY MOUTH EVERY DAY     Psychiatry:  Antidepressants - SSRI Failed - 01/26/2019  1:05 PM      Failed - Valid encounter within last 6 months    Recent Outpatient Visits          9 months ago Annual physical exam   [facility], Henrine Screws T, NP   1 year ago Mild intermittent asthma with acute exacerbation   [facility] Kathrine Haddock, NP   1 year ago Sore throat   [facility] Kathrine Haddock, NP   1 year ago Acute non-recurrent maxillary sinusitis   [facility] Kathrine Haddock, NP   1 year ago Cough   [place], NP             Passed - Completed PHQ-2 or PHQ-9 in the last 360 days.

## 2019-02-06 ENCOUNTER — Encounter: Payer: Self-pay | Admitting: Nurse Practitioner

## 2019-02-06 ENCOUNTER — Ambulatory Visit (INDEPENDENT_AMBULATORY_CARE_PROVIDER_SITE_OTHER): Payer: BC Managed Care – PPO | Admitting: Nurse Practitioner

## 2019-02-06 ENCOUNTER — Other Ambulatory Visit: Payer: Self-pay

## 2019-02-06 DIAGNOSIS — J4521 Mild intermittent asthma with (acute) exacerbation: Secondary | ICD-10-CM

## 2019-02-06 DIAGNOSIS — F3281 Premenstrual dysphoric disorder: Secondary | ICD-10-CM | POA: Diagnosis not present

## 2019-02-06 DIAGNOSIS — D509 Iron deficiency anemia, unspecified: Secondary | ICD-10-CM | POA: Diagnosis not present

## 2019-02-06 DIAGNOSIS — K219 Gastro-esophageal reflux disease without esophagitis: Secondary | ICD-10-CM | POA: Diagnosis not present

## 2019-02-06 NOTE — Assessment & Plan Note (Signed)
Chronic, stable.  Continue current medication regimen and check Mag level yearly.

## 2019-02-06 NOTE — Assessment & Plan Note (Signed)
Chronic, taking daily supplement.  Will obtain outpatient labs over next week to check anemia panel and CBC.  Continue supplement.  Return in 4 weeks.

## 2019-02-06 NOTE — Assessment & Plan Note (Signed)
Chronic, exacerbated at this time.  Will trial increase in Prozac to 40 MG, she just picked up 20 MG refill and will start taking two of these.  Script sent for 40 MG tablets.  Return in 4 weeks for follow-up.

## 2019-02-06 NOTE — Progress Notes (Signed)
There were no vitals taken for this visit.   Subjective:    [name], female    DOB: Sep 09, 1975, 43 y.o.   MRN: [id number]  HPI: Sydney White is a 43 y.o. female  Chief Complaint  Patient presents with  . Follow-up    . This visit was completed via Doximity due to the restrictions of the COVID-19 pandemic. All issues as above were discussed and addressed. Physical exam was done as above through visual confirmation on Doximity. If it was felt that the patient should be evaluated in the office, they were directed there. The patient verbally consented to this visit. . Location of the patient: home . Location of the provider: home . Those involved with this call:  . Provider: Marnee Guarneri, DNP . CMA: Yvonna Alanis, CMA . Front Desk/Registration: Jill Side  . Time spent on call: 15 minutes with patient face to face via video conference. More than 50% of this time was spent in counseling and coordination of care. 10 minutes total spent in review of patient's record and preparation of their chart.  . I verified patient identity using two factors (patient name and date of birth). Patient consents verbally to being seen via telemedicine visit today.    ASTHMA Has Albuterol as needed + takes Allegra & Flonase for allergies.  States the SOB has much improved since she started taking iron for her anemia.  Has not had to use her Albuterol in several months. Asthma status: stable Satisfied with current treatment?: yes Albuterol/rescue inhaler frequency:  Dyspnea frequency:  Wheezing frequency: Cough frequency:  Nocturnal symptom frequency:  Limitation of activity: no Current upper respiratory symptoms: no Triggers:  Home peak flows: Last Spirometry:  Failed/intolerant to following asthma meds:  Asthma meds in past:  Aerochamber/spacer use: no Visits to ER or Urgent Care in past year: no Pneumovax: Not up to Date Influenza: Up to Date   GERD  Continues on Omeprazole daily.  Reports good control with this regimen, has occasional heart burn at night, but this is often related to food she has ate. GERD control status: stable  Satisfied with current treatment? yes Heartburn frequency:  Medication side effects: no  Dysphagia: no Odynophagia:  no Hematemesis: no Blood in stool: no EGD: no   PMDD Continues on Prozac 20 MG daily. This is currently exacerbated due to her job and her husband losing his job during [place].  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 Has not been sleeping well and fatigue during daytime. Mood status: uncontrolled Satisfied with current treatment?: yes Symptom severity: mild  Duration of current treatment : chronic Side effects: no Medication compliance: good compliance Psychotherapy/counseling: none Depressed mood: no Anxious mood: yes Anhedonia: no Significant weight loss or gain: no Insomnia: yes hard to fall asleep Fatigue: no Feelings of worthlessness or guilt: no Impaired concentration/indecisiveness: no Suicidal ideations: no Hopelessness: no Crying spells: no Depression screen [facility] 2/9 02/06/2019 04/30/2018 07/22/2017  Decreased Interest 3 0 0  Down, Depressed, Hopeless - 1 0  PHQ - 2 Score 3 1 0  Altered sleeping 1 0 1  Tired, decreased energy 3 1 1   Change in appetite 3 2 1   Feeling bad or failure about yourself  1 1 0  Trouble concentrating 1 0 1  Moving slowly or fidgety/restless 0 0 0  Suicidal thoughts 0 1 -  PHQ-9 Score 12 6 4   Difficult  doing work/chores Somewhat difficult Not difficult at all -    ANEMIA Taking iron supplement daily and reports overall feels "much better".  Less fatigue than previously had and no shortness of breath.  Last H/H 9.3/31.2, was to supposed to come in for anemia panel, but did not. Anemia status: stable Etiology of anemia: iron deficiency Duration of anemia  treatment: 6 months Compliance with treatment: good compliance Iron supplementation side effects: no Severity of anemia: mild Fatigue: no Decreased exercise tolerance: no  Dyspnea on exertion: no Palpitations: no Bleeding: no Pica: no   Relevant past medical, surgical, family and social history reviewed and updated as indicated. Interim medical history since our last visit reviewed. Allergies and medications reviewed and updated.  Review of Systems  Constitutional: Negative for activity change, appetite change, diaphoresis, fatigue and fever.  Respiratory: Negative for cough, chest tightness, shortness of breath and wheezing.   Cardiovascular: Negative for chest pain, palpitations and leg swelling.  Gastrointestinal: Negative for abdominal distention, abdominal pain, constipation, diarrhea, nausea and vomiting.  Endocrine: Negative for cold intolerance and heat intolerance.  Neurological: Negative for dizziness, syncope, weakness, light-headedness, numbness and headaches.  Psychiatric/Behavioral: Positive for sleep disturbance. Negative for decreased concentration, self-injury and suicidal ideas. The patient is nervous/anxious.     Per HPI unless specifically indicated above     Objective:    There were no vitals taken for this visit.  Wt Readings from Last 3 Encounters:  04/30/18 216 lb (98 kg)  10/02/17 226 lb 12.8 oz (102.9 kg)  09/03/17 227 lb 6.4 oz (103.1 kg)    Physical Exam Vitals signs and nursing note reviewed.  Constitutional:      General: She is awake. She is not in acute distress.    Appearance: She is well-developed. She is not ill-appearing.  HENT:     Head: Normocephalic.     Right Ear: Hearing normal.     Left Ear: Hearing normal.  Eyes:     General: Lids are normal.        Right eye: No discharge.        Left eye: No discharge.     Conjunctiva/sclera: Conjunctivae normal.  Neck:     Musculoskeletal: Normal range of motion.  Pulmonary:      Effort: Pulmonary effort is normal. No accessory muscle usage or respiratory distress.  Neurological:     Mental Status: She is alert and oriented to person, place, and time.  Psychiatric:        Attention and Perception: Attention normal.        Mood and Affect: Mood normal.        Behavior: Behavior normal. Behavior is cooperative.        Thought Content: Thought content normal.        Judgment: Judgment normal.     Results for orders placed or performed in visit on 04/30/18  TSH  Result Value Ref Range   TSH 1.170 0.450 - 4.500 uIU/mL  Comprehensive metabolic panel  Result Value Ref Range   Glucose 80 65 - 99 mg/dL   BUN 8 6 - 24 mg/dL   Creatinine, Ser 0.89 0.57 - 1.00 mg/dL   GFR calc non Af Amer 81 >59 mL/min/1.73   GFR calc Af Amer 94 >59 mL/min/1.73   BUN/Creatinine Ratio 9 9 - 23   Sodium 136 134 - 144 mmol/L   Potassium 4.1 3.5 - 5.2 mmol/L   Chloride 98 96 - 106 mmol/L   CO2 22 20 -  29 mmol/L   Calcium 9.6 8.7 - 10.2 mg/dL   Total Protein 7.2 6.0 - 8.5 g/dL   Albumin 4.5 3.5 - 5.5 g/dL   Globulin, Total 2.7 1.5 - 4.5 g/dL   Albumin/Globulin Ratio 1.7 1.2 - 2.2   Bilirubin Total 0.5 0.0 - 1.2 mg/dL   Alkaline Phosphatase 100 39 - 117 IU/L   AST 18 0 - 40 IU/L   ALT 21 0 - 32 IU/L  CBC with Differential/Platelet  Result Value Ref Range   WBC 6.3 3.4 - 10.8 x10E3/uL   RBC 4.39 3.77 - 5.28 x10E6/uL   Hemoglobin 9.3 (L) 11.1 - 15.9 g/dL   Hematocrit 31.2 (L) 34.0 - 46.6 %   MCV 71 (L) 79 - 97 fL   MCH 21.2 (L) 26.6 - 33.0 pg   MCHC 29.8 (L) 31.5 - 35.7 g/dL   RDW 14.9 12.3 - 15.4 %   Platelets 465 (H) 150 - 450 x10E3/uL   Neutrophils 58 Not Estab. %   Lymphs 31 Not Estab. %   Monocytes 7 Not Estab. %   Eos 2 Not Estab. %   Basos 2 Not Estab. %   Neutrophils Absolute 3.7 1.4 - 7.0 x10E3/uL   Lymphocytes Absolute 1.9 0.7 - 3.1 x10E3/uL   Monocytes Absolute 0.4 0.1 - 0.9 x10E3/uL   EOS (ABSOLUTE) 0.1 0.0 - 0.4 x10E3/uL   Basophils Absolute 0.1 0.0 - 0.2  x10E3/uL   Immature Granulocytes 0 Not Estab. %   Immature Grans (Abs) 0.0 0.0 - 0.1 x10E3/uL  Lipid Panel w/o Chol/HDL Ratio  Result Value Ref Range   Cholesterol, Total 181 100 - 199 mg/dL   Triglycerides 68 0 - 149 mg/dL   HDL 72 >39 mg/dL   VLDL Cholesterol Cal 14 5 - 40 mg/dL   LDL Calculated 95 0 - 99 mg/dL  Magnesium  Result Value Ref Range   Magnesium 2.1 1.6 - 2.3 mg/dL      Assessment & Plan:   Problem List Items Addressed This Visit      Respiratory   Asthma    Chronic, stable without maintenance inhaler.  Minimal Albuterol use.  Continue current medication regimen.        Digestive   GERD (gastroesophageal reflux disease)    Chronic, stable.  Continue current medication regimen and check Mag level yearly.        Other   PMDD (premenstrual dysphoric disorder) - Primary    Chronic, exacerbated at this time.  Will trial increase in Prozac to 40 MG, she just picked up 20 MG refill and will start taking two of these.  Script sent for 40 MG tablets.  Return in 4 weeks for follow-up.      Anemia    Chronic, taking daily supplement.  Will obtain outpatient labs over next week to check anemia panel and CBC.  Continue supplement.  Return in 4 weeks.      Relevant Orders   Anemia panel   CBC with Differential/Platelet      I discussed the assessment and treatment plan with the patient. The patient was provided an opportunity to ask questions and all were answered. The patient agreed with the plan and demonstrated an understanding of the instructions.   The patient was advised to call back or seek an in-person evaluation if the symptoms worsen or if the condition fails to improve as anticipated.   I provided 15 minutes of time during this encounter.  Follow up plan: Return in  about 4 weeks (around 03/06/2019) for Mood, Anemia, BP check.

## 2019-02-06 NOTE — Assessment & Plan Note (Signed)
Chronic, stable without maintenance inhaler.  Minimal Albuterol use.  Continue current medication regimen.

## 2019-02-06 NOTE — Patient Instructions (Signed)
Anemia  Anemia is a condition in which you do not have enough red blood cells or hemoglobin. Hemoglobin is a substance in red blood cells that carries oxygen. When you do not have enough red blood cells or hemoglobin (are anemic), your body cannot get enough oxygen and your organs may not work properly. As a result, you may feel very tired or have other problems. What are the causes? Common causes of anemia include:  Excessive bleeding. Anemia can be caused by excessive bleeding inside or outside the body, including bleeding from the intestine or from periods in women.  Poor nutrition.  Long-lasting (chronic) kidney, thyroid, and liver disease.  Bone marrow disorders.  Cancer and treatments for cancer.  HIV (human immunodeficiency virus) and AIDS (acquired immunodeficiency syndrome).  Treatments for HIV and AIDS.  Spleen problems.  Blood disorders.  Infections, medicines, and autoimmune disorders that destroy red blood cells. What are the signs or symptoms? Symptoms of this condition include:  Minor weakness.  Dizziness.  Headache.  Feeling heartbeats that are irregular or faster than normal (palpitations).  Shortness of breath, especially with exercise.  Paleness.  Cold sensitivity.  Indigestion.  Nausea.  Difficulty sleeping.  Difficulty concentrating. Symptoms may occur suddenly or develop slowly. If your anemia is mild, you may not have symptoms. How is this diagnosed? This condition is diagnosed based on:  Blood tests.  Your medical history.  A physical exam.  Bone marrow biopsy. Your health care provider may also check your stool (feces) for blood and may do additional testing to look for the cause of your bleeding. You may also have other tests, including:  Imaging tests, such as a CT scan or MRI.  Endoscopy.  Colonoscopy. How is this treated? Treatment for this condition depends on the cause. If you continue to lose a lot of blood, you may  need to be treated at a hospital. Treatment may include:  Taking supplements of iron, vitamin S31, or folic acid.  Taking a hormone medicine (erythropoietin) that can help to stimulate red blood cell growth.  Having a blood transfusion. This may be needed if you lose a lot of blood.  Making changes to your diet.  Having surgery to remove your spleen. Follow these instructions at home:  Take over-the-counter and prescription medicines only as told by your health care provider.  Take supplements only as told by your health care provider.  Follow any diet instructions that you were given.  Keep all follow-up visits as told by your health care provider. This is important. Contact a health care provider if:  You develop new bleeding anywhere in the body. Get help right away if:  You are very weak.  You are short of breath.  You have pain in your abdomen or chest.  You are dizzy or feel faint.  You have trouble concentrating.  You have bloody or black, tarry stools.  You vomit repeatedly or you vomit up blood. Summary  Anemia is a condition in which you do not have enough red blood cells or enough of a substance in your red blood cells that carries oxygen (hemoglobin).  Symptoms may occur suddenly or develop slowly.  If your anemia is mild, you may not have symptoms.  This condition is diagnosed with blood tests as well as a medical history and physical exam. Other tests may be needed.  Treatment for this condition depends on the cause of the anemia. This information is not intended to replace advice given to you by  your health care provider. Make sure you discuss any questions you have with your health care provider. Document Released: 06/28/2004 Document Revised: 05/03/2017 Document Reviewed: 06/22/2016 Elsevier Patient Education  2020 [employer].

## 2019-02-19 ENCOUNTER — Other Ambulatory Visit: Payer: Self-pay | Admitting: Nurse Practitioner

## 2019-02-19 ENCOUNTER — Telehealth: Payer: Self-pay | Admitting: Obstetrics & Gynecology

## 2019-02-19 DIAGNOSIS — N920 Excessive and frequent menstruation with regular cycle: Secondary | ICD-10-CM

## 2019-02-19 MED ORDER — BUSPIRONE HCL 5 MG PO TABS: 5.0000 mg | ORAL_TABLET | Freq: Two times a day (BID) | ORAL | 3 refills | Status: DC | PRN

## 2019-02-19 NOTE — Progress Notes (Signed)
Discussed with patient, she would like to trial Buspar for her increased anxiety at work and will place referral to OB/GYN for her menorrhagia to discuss options to include pill, injections, IUD, or ablation to reduce amount of work hours lost due to heavy period days.

## 2019-02-19 NOTE — Telephone Encounter (Signed)
[facility] referring for Menorrhagia with regular cycle. Called and spoke with patient to schedule appointment. Patient would like a call back later today to schedule appointment due to a parent teacher consult.

## 2019-02-27 ENCOUNTER — Encounter: Payer: BC Managed Care – PPO | Admitting: Obstetrics and Gynecology

## 2019-03-12 ENCOUNTER — Encounter: Payer: BC Managed Care – PPO | Admitting: Obstetrics and Gynecology

## 2019-03-17 NOTE — Progress Notes (Signed)
Sydney Lick, NP   Chief Complaint  Patient presents with  . Referral    Heavy cycles with clots, and migraine on first day of cycle, and having GI problems     HPI:      Sydney White is a 43 y.o. No obstetric history on file. who LMP was Patient's last menstrual period was 03/04/2019 (exact date)., presents today for NP eval of menorrhagia, referred by PCP. Pt's menses are monthly, lasting 4-5 days, changing tampons Q20-30 min the first 2 heavy days. Has occas mid-cycle spotting, mild dysmen with menses, improved with NSAIDs. Menses heavier for about 1 1/2 yrs (used to change tampons Q2-3 hrs). Hx of anemia on 11/19 labs, taking iron supp and feels better. Hx of leio in past; hx of PCOS in adolescence, controlled with OCPs, but pt states her cycles have always been regular. Pt now with HTN for past yr.  FH PCOS in sisters and FH HTN in mom, sister, MGM. Pt with PMDD, taking prozac with sx improvement.  Pt is not currently sex active; not interested in preserving fertility. Pap/annual current with PCP.  Past Medical History:  Diagnosis Date  . Allergy   . Anxiety   . Asthma   . GERD (gastroesophageal reflux disease)   . PCOS (polycystic ovarian syndrome)   . PMDD (premenstrual dysphoric disorder)     Past Surgical History:  Procedure Laterality Date  . CESAREAN SECTION  01/04/2013  . DENTAL SURGERY      Family History  Problem Relation Age of Onset  . Hypertension Mother   . Arthritis Mother   . Asthma Mother   . Hypertension Sister   . Asthma Sister   . Arthritis Sister   . Polycystic ovary syndrome Sister   . Hypertension Maternal Grandmother   . Kidney disease Maternal Grandmother   . GER disease Maternal Grandmother   . Heart disease Maternal Grandmother   . COPD Maternal Grandmother   . Alcohol abuse Maternal Grandfather   . Emphysema Maternal Grandfather   . Arthritis Sister   . Asthma Sister   . Polycystic ovary syndrome Sister      Social History   Socioeconomic History  . Marital status: Married    Spouse name: Not on file  . Number of children: Not on file  . Years of education: Not on file  . Highest education level: Not on file  Occupational History  . Not on file  Social Needs  . Financial resource strain: Not on file  . Food insecurity    Worry: Not on file    Inability: Not on file  . Transportation needs    Medical: Not on file    Non-medical: Not on file  Tobacco Use  . Smoking status: Never Smoker  . Smokeless tobacco: Never Used  Substance and Sexual Activity  . Alcohol use: Yes    Comment: socially  . Drug use: No  . Sexual activity: Not Currently    Birth control/protection: None, Condom  Lifestyle  . Physical activity    Days per week: Not on file    Minutes per session: Not on file  . Stress: Not on file  Relationships  . Social Herbalist on phone: Not on file    Gets together: Not on file    Attends religious service: Not on file    Active member of club or organization: Not on file    Attends meetings of clubs or  organizations: Not on file    Relationship status: Not on file  . Intimate partner violence    Fear of current or ex partner: Not on file    Emotionally abused: Not on file    Physically abused: Not on file    Forced sexual activity: Not on file  Other Topics Concern  . Not on file  Social History Narrative  . Not on file    Outpatient Medications Prior to Visit  Medication Sig Dispense Refill  . busPIRone (BUSPAR) 5 MG tablet Take 1 tablet (5 mg total) by mouth 2 (two) times daily as needed (for anxiety). 60 tablet 3  . fexofenadine (ALLEGRA) 180 MG tablet Take 180 mg by mouth daily.    Marland Kitchen FLUoxetine (PROZAC) 20 MG capsule TAKE 1 CAPSULE BY MOUTH EVERY DAY 30 capsule 0  . montelukast (SINGULAIR) 10 MG tablet Take 1 tablet (10 mg total) by mouth daily. 90 tablet 3  . omeprazole (PRILOSEC) 20 MG capsule Take 1 capsule (20 mg total) by mouth daily. 90  capsule 3  . Cetirizine HCl (ZYRTEC PO) Take by mouth daily.    Marland Kitchen albuterol (PROVENTIL HFA;VENTOLIN HFA) 108 (90 Base) MCG/ACT inhaler Inhale 2 puffs into the lungs every 6 (six) hours as needed for wheezing or shortness of breath. (Patient not taking: Reported on 03/18/2019) 1 Inhaler 1  . fluticasone (FLONASE) 50 MCG/ACT nasal spray Place 1 spray into both nostrils daily.     No facility-administered medications prior to visit.       ROS:  Review of Systems  Constitutional: Positive for fatigue. Negative for fever and unexpected weight change.  Respiratory: Positive for cough and shortness of breath. Negative for wheezing.   Cardiovascular: Negative for chest pain, palpitations and leg swelling.  Gastrointestinal: Negative for blood in stool, constipation, diarrhea, nausea and vomiting.  Endocrine: Negative for cold intolerance, heat intolerance and polyuria.  Genitourinary: Negative for dyspareunia, dysuria, flank pain, frequency, genital sores, hematuria, menstrual problem, pelvic pain, urgency, vaginal bleeding, vaginal discharge and vaginal pain.  Musculoskeletal: Negative for back pain, joint swelling and myalgias.  Skin: Negative for rash.  Neurological: Positive for dizziness and headaches. Negative for syncope, light-headedness and numbness.  Hematological: Negative for adenopathy.  Psychiatric/Behavioral: Positive for agitation and dysphoric mood. Negative for confusion, sleep disturbance and suicidal ideas. The patient is not nervous/anxious.   BREAST: No symptoms   OBJECTIVE:   Vitals:  BP (!) 168/108   Pulse 93   Ht 5\' 3"  (1.6 m)   Wt 246 lb (111.6 kg)   LMP 03/04/2019 (Exact Date)   BMI 43.58 kg/m   Physical Exam Vitals signs reviewed.  Constitutional:      Appearance: She is well-developed.  Neck:     Musculoskeletal: Normal range of motion.  Pulmonary:     Effort: Pulmonary effort is normal.  Genitourinary:    General: Normal vulva.     Pubic Area: No  rash.      Labia:        Right: No rash, tenderness or lesion.        Left: No rash, tenderness or lesion.      Vagina: Normal. No vaginal discharge, erythema or tenderness.     Cervix: Normal.     Uterus: Normal. Enlarged. Not tender.      Adnexa: Right adnexa normal and left adnexa normal.       Right: No mass or tenderness.         Left: No mass or  tenderness.    Musculoskeletal: Normal range of motion.  Skin:    General: Skin is warm and dry.  Neurological:     General: No focal deficit present.     Mental Status: She is alert and oriented to person, place, and time.  Psychiatric:        Mood and Affect: Mood normal.        Behavior: Behavior normal.        Thought Content: Thought content normal.        Judgment: Judgment normal.     Assessment/Plan: Menorrhagia with regular cycle - Plan: US PELVIS TRANSVAGINAL NON-OB (TV ONLY); Sx increase for past 1 1/2 yrs with subsequent anemia. Hx of leio. Check GYN u/s. Will call with results. Discussed mgmt options of prog only methods (due to HTN), IUD, ablation, lysteda, depending on u/s results.   Leiomyoma - Plan: US PELVIS TRANSVAGINAL NON-OB (TV ONLY)  Iron deficiency anemia due to chronic blood loss--followed by PCP.    Return in about 1 day (around 03/19/2019) for GYN u/s for menorrhagia--[facility] to call pt.  Janera Peugh B. Luke Rigsbee, PA-C 03/18/2019 10:04 AM

## 2019-03-18 ENCOUNTER — Ambulatory Visit (INDEPENDENT_AMBULATORY_CARE_PROVIDER_SITE_OTHER): Payer: BC Managed Care – PPO | Admitting: Obstetrics and Gynecology

## 2019-03-18 ENCOUNTER — Other Ambulatory Visit: Payer: Self-pay

## 2019-03-18 ENCOUNTER — Encounter: Payer: Self-pay | Admitting: Obstetrics and Gynecology

## 2019-03-18 VITALS — BP 168/108 | HR 93 | Ht 63.0 in | Wt 246.0 lb

## 2019-03-18 DIAGNOSIS — D219 Benign neoplasm of connective and other soft tissue, unspecified: Secondary | ICD-10-CM

## 2019-03-18 DIAGNOSIS — D500 Iron deficiency anemia secondary to blood loss (chronic): Secondary | ICD-10-CM | POA: Diagnosis not present

## 2019-03-18 DIAGNOSIS — N920 Excessive and frequent menstruation with regular cycle: Secondary | ICD-10-CM | POA: Insufficient documentation

## 2019-03-18 NOTE — Patient Instructions (Signed)
I value your feedback and entrusting us with your care. If you get a [place] patient survey, I would appreciate you taking the time to let us know about your experience today. Thank you! 

## 2019-03-20 ENCOUNTER — Other Ambulatory Visit: Payer: Self-pay

## 2019-03-20 ENCOUNTER — Ambulatory Visit (INDEPENDENT_AMBULATORY_CARE_PROVIDER_SITE_OTHER): Payer: BC Managed Care – PPO

## 2019-03-20 DIAGNOSIS — D250 Submucous leiomyoma of uterus: Secondary | ICD-10-CM

## 2019-03-20 DIAGNOSIS — D219 Benign neoplasm of connective and other soft tissue, unspecified: Secondary | ICD-10-CM

## 2019-03-20 DIAGNOSIS — D252 Subserosal leiomyoma of uterus: Secondary | ICD-10-CM

## 2019-03-20 DIAGNOSIS — N920 Excessive and frequent menstruation with regular cycle: Secondary | ICD-10-CM | POA: Diagnosis not present

## 2019-03-23 ENCOUNTER — Telehealth: Payer: Self-pay | Admitting: Obstetrics and Gynecology

## 2019-03-23 NOTE — Telephone Encounter (Signed)
Pt aware of GYN u/s results for menorrhagia with regular cycle. Discussed prog options/ablation/IUD/lysteda. Pt would like Mirena. Will RTO with menses for insertion. F/u prn.   ULTRASOUND REPORT  Location: [facility]  Date of Service: 03/20/2019    Indications: Menorrhagia Findings:  The uterus is anteverted and measures 9.7 x 5.3 x 5.2cm. Echo texture is homogenous with evidence of focal masses. Within the uterus are multiple suspected fibroids measuring: Fibroid 1:  1.7 x 1.6 x 1.1cm (ML, SS) Fibroid 2:  1.9 x 1.9 x 1.9cm (high, SM) Fibroid 3:  1.0 x 1.1 x 0.8cm (high, SS)  The Endometrium measures 8.1 mm.  Right Ovary measures 2.2 x 1.8 x 1.6 cm. It is normal in appearance. Left Ovary measures 2.9 x 2.1 x 2.0 cm with dominant follicle.  Survey of the adnexa demonstrates no adnexal masses. There is no free fluid in the cul de sac.  Impression: 1. 3 fibroids as described above, otherwise normal gyn ultrasound.   Recommendations: 1.Clinical correlation with the patient's History and Physical Exam.   Vita Barley, RT

## 2019-03-26 ENCOUNTER — Ambulatory Visit: Payer: BC Managed Care – PPO | Admitting: Nurse Practitioner

## 2019-04-01 ENCOUNTER — Other Ambulatory Visit: Payer: Self-pay | Admitting: Nurse Practitioner

## 2019-04-01 ENCOUNTER — Telehealth: Payer: Self-pay

## 2019-04-01 ENCOUNTER — Telehealth: Payer: Self-pay | Admitting: Obstetrics and Gynecology

## 2019-04-01 MED ORDER — FLUOXETINE HCL 20 MG PO CAPS: ORAL_CAPSULE | ORAL | 3 refills | Status: DC

## 2019-04-01 NOTE — Telephone Encounter (Signed)
Noted. Mirena reserved for this patient. 

## 2019-04-01 NOTE — Telephone Encounter (Signed)
mirena 10/29 with abc at 250

## 2019-04-01 NOTE — Telephone Encounter (Signed)
Completed.

## 2019-04-01 NOTE — Telephone Encounter (Signed)
Patient is requesting a refill on Fluoxetine

## 2019-04-02 ENCOUNTER — Encounter: Payer: Self-pay | Admitting: Obstetrics and Gynecology

## 2019-04-02 ENCOUNTER — Ambulatory Visit (INDEPENDENT_AMBULATORY_CARE_PROVIDER_SITE_OTHER): Payer: BC Managed Care – PPO | Admitting: Obstetrics and Gynecology

## 2019-04-02 ENCOUNTER — Other Ambulatory Visit: Payer: Self-pay

## 2019-04-02 VITALS — BP 140/100 | Ht 63.0 in | Wt 245.0 lb

## 2019-04-02 DIAGNOSIS — Z30430 Encounter for insertion of intrauterine contraceptive device: Secondary | ICD-10-CM | POA: Diagnosis not present

## 2019-04-02 DIAGNOSIS — Z538 Procedure and treatment not carried out for other reasons: Secondary | ICD-10-CM

## 2019-04-02 MED ORDER — MISOPROSTOL 100 MCG PO TABS: 100.0000 ug | ORAL_TABLET | Freq: Once | ORAL | 0 refills | Status: DC

## 2019-04-02 NOTE — Progress Notes (Signed)
   Chief Complaint  Patient presents with  . Contraception    Mirena insertion     IUD PROCEDURE NOTE:  Sydney White is a 43 y.o. G1P1001 here for Mirena  IUD insertion for menorrhagia with regular cycle. Had GYN u/s with 3 small leio.   BP (!) 140/100   Ht 5\' 3"  (1.6 m)   Wt 245 lb (111.1 kg)   LMP 03/28/2019 (Approximate)   BMI 43.40 kg/m   IUD Insertion Procedure Note Patient identified, informed consent performed, consent signed.   Discussed risks of irregular bleeding, cramping, infection, malpositioning or misplacement of the IUD outside the uterus which may require further procedure such as laparoscopy, risk of failure <1%. Time out was performed.    Speculum placed in the vagina.  Cervix visualized.  Cleaned with Betadine x 2.  Grasped anteriorly with a single tooth tenaculum.  Unable to sound uterus or get through internal cx os. Pt is s/p C/S and never dilated. No prior cx surgeries.  ASSESSMENT/PLAN:  Unsuccessful IUD insertion - Plan: misoprostol (CYTOTEC) 100 MCG tablet; Not able to penetrate internal cx os;  pt to RTO with MD. Rx cytotec to take 1 hr before. May need dilators.   Menorrhagia with regular cycle--plan to try IUD for sx control.   Meds ordered this encounter  Medications  . misoprostol (CYTOTEC) 100 MCG tablet    Sig: Take 1 tablet (100 mcg total) by mouth once for 1 dose. 1 hour before appt    Dispense:  1 tablet    Refill:  0    Order Specific Question:   Supervising Provider    Answer:   Gae Dry [id number]   Return in about 3 days (around 04/05/2019) for IUD insertion with MD.   Deirdre Evener. Copland, PA-C 04/02/2019 4:29 PM

## 2019-04-02 NOTE — Patient Instructions (Signed)
I value your feedback and entrusting us with your care. If you get a [place] patient survey, I would appreciate you taking the time to let us know about your experience today. Thank you!  [facility] [phone]  Instructions after IUD insertion  Most women experience no significant problems after insertion of an IUD, however minor cramping and spotting for a few days is common. Cramps may be treated with ibuprofen 800mg every 8 hours or Tylenol 650 mg every 4 hours. Contact [facility] immediately if you experience any of the following symptoms during the next week: temperature >99.6 degrees, worsening pelvic pain, abdominal pain, fainting, unusually heavy vaginal bleeding, foul vaginal discharge, or if you think you have expelled the IUD.  Nothing inserted in the vagina for 48 hours. You will be scheduled for a follow up visit in approximately four weeks.  You should check monthly to be sure you can feel the IUD strings in the upper vagina. If you are having a monthly period, try to check after each period. If you cannot feel the IUD strings,  contact [facility] immediately so we can do an exam to determine if the IUD has been expelled.   Please use backup protection until we can confirm the IUD is in place.  Call [facility] if you are exposed to or diagnosed with a sexually transmitted infection, as we will need to discuss whether it is safe for you to continue using an IUD.   

## 2019-04-03 NOTE — Telephone Encounter (Signed)
Failed insertion w/[facility] 04/02/2019. Mirena remains reserved for pt apt w/[facility] 04/07/2019

## 2019-04-07 ENCOUNTER — Ambulatory Visit (INDEPENDENT_AMBULATORY_CARE_PROVIDER_SITE_OTHER): Payer: BC Managed Care – PPO | Admitting: Obstetrics & Gynecology

## 2019-04-07 ENCOUNTER — Encounter: Payer: Self-pay | Admitting: Obstetrics & Gynecology

## 2019-04-07 ENCOUNTER — Other Ambulatory Visit: Payer: Self-pay

## 2019-04-07 VITALS — BP 140/90 | Ht 63.0 in | Wt 248.0 lb

## 2019-04-07 DIAGNOSIS — Z30430 Encounter for insertion of intrauterine contraceptive device: Secondary | ICD-10-CM

## 2019-04-07 NOTE — Progress Notes (Signed)
  IUD PROCEDURE NOTE:  Sydney White is a 43 y.o. G1P1001 here for MIRENA IUD insertion to assist w menorrhagia and anemia. No GYN concerns.  Last pap smear was normal.  IUD Insertion Procedure Note Patient identified, informed consent performed, consent signed.   Discussed risks of irregular bleeding, cramping, infection, malpositioning or misplacement of the IUD outside the uterus which may require further procedure such as laparoscopy, risk of failure <1%. Pt had IBF and Cytotec prior to placement.  Last attempt unsuccessful due to cervical stenosis.  A bimanual exam showed the uterus to be midposition.  Speculum placed in the vagina.  Cervix visualized.  Cleaned with Betadine x 2.  Grasped anteriorly with a single tooth tenaculum. Dilator used on cervix. Uterus sounded to 7 cm.   IUD placed per manufacturer's recommendations.  Strings trimmed to 3 cm. Tenaculum was removed, good hemostasis noted.  Patient tolerated procedure well.   Patient was given post-procedure instructions.  She was advised to have backup contraception for one week.  Patient was also asked to check IUD strings periodically and follow up in 4 weeks for IUD check.  Barnett Applebaum, MD, [facility], [facility] 04/07/2019  9:20 AM

## 2019-04-07 NOTE — Patient Instructions (Signed)
Intrauterine Device Insertion, Care After  This sheet gives you information about how to care for yourself after your procedure. Your health care provider may also give you more specific instructions. If you have problems or questions, contact your health care provider. What can I expect after the procedure? After the procedure, it is common to have:  Cramps and pain in the abdomen.  Light bleeding (spotting) or heavier bleeding that is like your menstrual period. This may last for up to a few days.  Lower back pain.  Dizziness.  Headaches.  Nausea. Follow these instructions at home:  Before resuming sexual activity, check to make sure that you can feel the IUD string(s). You should be able to feel the end of the string(s) below the opening of your cervix. If your IUD string is in place, you may resume sexual activity. ? If you had a hormonal IUD inserted more than 7 days after your most recent period started, you will need to use a backup method of birth control for 7 days after IUD insertion. Ask your health care provider whether this applies to you.  Continue to check that the IUD is still in place by feeling for the string(s) after every menstrual period, or once a month.  Take over-the-counter and prescription medicines only as told by your health care provider.  Do not drive or use heavy machinery while taking prescription pain medicine.  Keep all follow-up visits as told by your health care provider. This is important. Contact a health care provider if:  You have bleeding that is heavier or lasts longer than a normal menstrual cycle.  You have a fever.  You have cramps or abdominal pain that get worse or do not get better with medicine.  You develop abdominal pain that is new or is not in the same area of earlier cramping and pain.  You feel lightheaded or weak.  You have abnormal or bad-smelling discharge from your vagina.  You have pain during sexual activity.   You have any of the following problems with your IUD string(s): ? The string bothers or hurts you or your sexual partner. ? You cannot feel the string. ? The string has gotten longer.  You can feel the IUD in your vagina.  You think you may be pregnant, or you miss your menstrual period.  You think you may have an STI (sexually transmitted infection). Get help right away if:  You have flu-like symptoms.  You have a fever and chills.  You can feel that your IUD has slipped out of place. Summary  After the procedure, it is common to have cramps and pain in the abdomen. It is also common to have light bleeding (spotting) or heavier bleeding that is like your menstrual period.  Continue to check that the IUD is still in place by feeling for the string(s) after every menstrual period, or once a month.  Keep all follow-up visits as told by your health care provider. This is important.  Contact your health care provider if you have problems with your IUD string(s), such as the string getting longer or bothering you or your sexual partner. This information is not intended to replace advice given to you by your health care provider. Make sure you discuss any questions you have with your health care provider. Document Released: 01/17/2011 Document Revised: 05/03/2017 Document Reviewed: 04/11/2016 Elsevier Patient Education  2020 [employer]  

## 2019-04-09 ENCOUNTER — Other Ambulatory Visit: Payer: Self-pay

## 2019-04-10 ENCOUNTER — Ambulatory Visit (INDEPENDENT_AMBULATORY_CARE_PROVIDER_SITE_OTHER): Payer: BC Managed Care – PPO | Admitting: Nurse Practitioner

## 2019-04-10 ENCOUNTER — Encounter: Payer: Self-pay | Admitting: Nurse Practitioner

## 2019-04-10 ENCOUNTER — Other Ambulatory Visit: Payer: Self-pay

## 2019-04-10 DIAGNOSIS — I10 Essential (primary) hypertension: Secondary | ICD-10-CM | POA: Insufficient documentation

## 2019-04-10 DIAGNOSIS — D500 Iron deficiency anemia secondary to blood loss (chronic): Secondary | ICD-10-CM | POA: Diagnosis not present

## 2019-04-10 DIAGNOSIS — F3281 Premenstrual dysphoric disorder: Secondary | ICD-10-CM | POA: Diagnosis not present

## 2019-04-10 DIAGNOSIS — Z23 Encounter for immunization: Secondary | ICD-10-CM | POA: Diagnosis not present

## 2019-04-10 DIAGNOSIS — R030 Elevated blood-pressure reading, without diagnosis of hypertension: Secondary | ICD-10-CM | POA: Diagnosis not present

## 2019-04-10 DIAGNOSIS — D219 Benign neoplasm of connective and other soft tissue, unspecified: Secondary | ICD-10-CM

## 2019-04-10 NOTE — Assessment & Plan Note (Signed)
Recently noted on U/S by GYN.  Mirena in place.  Continue to collaborate with GYN as needed.

## 2019-04-10 NOTE — Assessment & Plan Note (Signed)
Chronic, ongoing.  Recent placement of Mirena by GYN.  Obtain CBC and anemia panel today (ordered as future labs, but did not obtain in September).  Continue to collaborate with GYN as needed.

## 2019-04-10 NOTE — Progress Notes (Addendum)
BP 138/80 (BP Location: Left Arm, Patient Position: Sitting)   Pulse 84 Comment: apical  Temp 99 F (37.2 C) (Oral)   Ht 5\' 3"  (1.6 m)   Wt 249 lb (112.9 kg)   LMP 03/28/2019 (Approximate)   SpO2 98%   BMI 44.11 kg/m    Subjective:    [name], female    DOB: 07-03-1975, 43 y.o.   MRN: [id number]  HPI: Sydney White is a 43 y.o. female  Chief Complaint  Patient presents with  . Mood  . Anemia    pt would like to discuss about OBGYN appt done last tusday  . Hypertension   DEPRESSION Continues Prozac 20 MG and Buspirone 5 MG BID PRN.  She reports Buspar is helping + is walking during daytime which helps.   Mood status: stable Satisfied with current treatment?: yes Symptom severity: mild  Duration of current treatment : chronic Side effects: no Medication compliance: good compliance Psychotherapy/counseling: none Previous psychiatric medications:  Prozac and Buspar Depressed mood: yes Anxious mood: no Anhedonia: no Significant weight loss or gain: no Insomnia: none Fatigue: no Feelings of worthlessness or guilt: no Impaired concentration/indecisiveness: yes Suicidal ideations: no Hopelessness: no Crying spells: no Depression screen [facility] 2/9 04/10/2019 02/06/2019 04/30/2018 07/22/2017  Decreased Interest 1 3 0 0  Down, Depressed, Hopeless 1 - 1 0  PHQ - 2 Score 2 3 1  0  Altered sleeping 1 1 0 1  Tired, decreased energy 1 3 1 1   Change in appetite 1 3 2 1   Feeling bad or failure about yourself  0 1 1 0  Trouble concentrating 0 1 0 1  Moving slowly or fidgety/restless 0 0 0 0  Suicidal thoughts 0 0 1 -  PHQ-9 Score 5 12 6 4   Difficult doing work/chores Not difficult at all Somewhat difficult Not difficult at all -   HYPERTENSION Is working on [employer] and increasing exercise at home.  Tracking miles with [employer].  No current medications. Hypertension status: stable  Satisfied with current treatment? yes Duration of hypertension: years  BP monitoring frequency:  not checking BP range: none BP medication side effects:  no Aspirin: no Recurrent headaches: no Visual changes: no Palpitations: no Dyspnea: no Chest pain: no Lower extremity edema: no Dizzy/lightheaded: no   ANEMIA: History of menorrhagia.  Recently had Mirena placed by GYN, placed by Dr. Kenton Kingfisher last week.  She reports this is working well, had cramping at first but this has improved.  U/S performed by GYN and multiple fibroids noted, which patient reports she was told about in past.  Missed future labs that were to be done in September.  Relevant past medical, surgical, family and social history reviewed and updated as indicated. Interim medical history since our last visit reviewed. Allergies and medications reviewed and updated.  Review of Systems  Constitutional: Negative for activity change, appetite change, diaphoresis, fatigue and fever.  Respiratory: Negative for cough, chest tightness and shortness of breath.   Cardiovascular: Negative for chest pain, palpitations and leg swelling.  Gastrointestinal: Negative for abdominal distention, abdominal pain, constipation, diarrhea, nausea and vomiting.  Neurological: Negative for dizziness, syncope, weakness, light-headedness, numbness and headaches.  Psychiatric/Behavioral: Negative.     Per HPI unless specifically indicated above     Objective:    BP 138/80 (BP Location: Left Arm, Patient Position: Sitting)   Pulse 84 Comment: apical  Temp 99 F (37.2 C) (Oral)   Ht 5\' 3"  (1.6 m)   Wt  249 lb (112.9 kg)   LMP 03/28/2019 (Approximate)   SpO2 98%   BMI 44.11 kg/m   Wt Readings from Last 3 Encounters:  04/10/19 249 lb (112.9 kg)  04/07/19 248 lb (112.5 kg)  04/02/19 245 lb (111.1 kg)    Physical Exam Vitals signs and nursing note reviewed.  Constitutional:      General: She is awake. She is not in acute distress.    Appearance: She is well-developed. She is obese. She is not ill-appearing.   HENT:     Head: Normocephalic.     Right Ear: Hearing normal.     Left Ear: Hearing normal.  Eyes:     General: Lids are normal.        Right eye: No discharge.        Left eye: No discharge.     Conjunctiva/sclera: Conjunctivae normal.     Pupils: Pupils are equal, round, and reactive to light.  Neck:     Musculoskeletal: Normal range of motion and neck supple.     Thyroid: No thyromegaly.     Vascular: No carotid bruit.  Cardiovascular:     Rate and Rhythm: Normal rate and regular rhythm.     Heart sounds: Normal heart sounds. No murmur. No gallop.   Pulmonary:     Effort: Pulmonary effort is normal. No accessory muscle usage or respiratory distress.     Breath sounds: Normal breath sounds.  Abdominal:     General: Bowel sounds are normal.     Palpations: Abdomen is soft.  Musculoskeletal:     Right lower leg: No edema.     Left lower leg: No edema.  Skin:    General: Skin is warm and dry.  Neurological:     Mental Status: She is alert and oriented to person, place, and time.  Psychiatric:        Attention and Perception: Attention normal.        Mood and Affect: Mood normal.        Behavior: Behavior normal. Behavior is cooperative.        Thought Content: Thought content normal.        Judgment: Judgment normal.     Results for orders placed or performed in visit on 04/30/18  TSH  Result Value Ref Range   TSH 1.170 0.450 - 4.500 uIU/mL  Comprehensive metabolic panel  Result Value Ref Range   Glucose 80 65 - 99 mg/dL   BUN 8 6 - 24 mg/dL   Creatinine, Ser 0.89 0.57 - 1.00 mg/dL   GFR calc non Af Amer 81 >59 mL/min/1.73   GFR calc Af Amer 94 >59 mL/min/1.73   BUN/Creatinine Ratio 9 9 - 23   Sodium 136 134 - 144 mmol/L   Potassium 4.1 3.5 - 5.2 mmol/L   Chloride 98 96 - 106 mmol/L   CO2 22 20 - 29 mmol/L   Calcium 9.6 8.7 - 10.2 mg/dL   Total Protein 7.2 6.0 - 8.5 g/dL   Albumin 4.5 3.5 - 5.5 g/dL   Globulin, Total 2.7 1.5 - 4.5 g/dL   Albumin/Globulin  Ratio 1.7 1.2 - 2.2   Bilirubin Total 0.5 0.0 - 1.2 mg/dL   Alkaline Phosphatase 100 39 - 117 IU/L   AST 18 0 - 40 IU/L   ALT 21 0 - 32 IU/L  CBC with Differential/Platelet  Result Value Ref Range   WBC 6.3 3.4 - 10.8 x10E3/uL   RBC 4.39 3.77 - 5.28 x10E6/uL  Hemoglobin 9.3 (L) 11.1 - 15.9 g/dL   Hematocrit 31.2 (L) 34.0 - 46.6 %   MCV 71 (L) 79 - 97 fL   MCH 21.2 (L) 26.6 - 33.0 pg   MCHC 29.8 (L) 31.5 - 35.7 g/dL   RDW 14.9 12.3 - 15.4 %   Platelets 465 (H) 150 - 450 x10E3/uL   Neutrophils 58 Not Estab. %   Lymphs 31 Not Estab. %   Monocytes 7 Not Estab. %   Eos 2 Not Estab. %   Basos 2 Not Estab. %   Neutrophils Absolute 3.7 1.4 - 7.0 x10E3/uL   Lymphocytes Absolute 1.9 0.7 - 3.1 x10E3/uL   Monocytes Absolute 0.4 0.1 - 0.9 x10E3/uL   EOS (ABSOLUTE) 0.1 0.0 - 0.4 x10E3/uL   Basophils Absolute 0.1 0.0 - 0.2 x10E3/uL   Immature Granulocytes 0 Not Estab. %   Immature Grans (Abs) 0.0 0.0 - 0.1 x10E3/uL  Lipid Panel w/o Chol/HDL Ratio  Result Value Ref Range   Cholesterol, Total 181 100 - 199 mg/dL   Triglycerides 68 0 - 149 mg/dL   HDL 72 >39 mg/dL   VLDL Cholesterol Cal 14 5 - 40 mg/dL   LDL Calculated 95 0 - 99 mg/dL  Magnesium  Result Value Ref Range   Magnesium 2.1 1.6 - 2.3 mg/dL      Assessment & Plan:   Problem List Items Addressed This Visit      Other   PMDD (premenstrual dysphoric disorder)    Chronic, exacerbated at this time due to Covid.  Continue Prozac daily and Buspar as needed.  Denies SI/HI.  Return in 6 months.      Iron deficiency anemia due to chronic blood loss    Chronic, ongoing.  Recent placement of Mirena by GYN.  Obtain CBC and anemia panel today (ordered as future labs, but did not obtain in September).  Continue to collaborate with GYN as needed.      Leiomyoma    Recently noted on U/S by GYN.  Mirena in place.  Continue to collaborate with GYN as needed.      Elevated BP without diagnosis of hypertension    Ongoing, initial  elevated but repeat improved.  Recommend she check BP at home at least 3 mornings a week + continue focus on DASH diet and regular exercise with goal of modest weight loss.  Return in 6 months.  If consistent elevations noted consider Lisinopril or Losartan.  CMP today.      Relevant Orders   Comprehensive metabolic panel   Morbid obesity ([place]) - Primary    BMI 44.11.  Recommend continued focus on health diet choices and regular physical activity (30 minutes 5 days a week).       Other Visit Diagnoses    Flu vaccine need       Relevant Orders   Flu Vaccine QUAD 6+ mos PF IM (Fluarix Quad PF) (Completed)       Follow up plan: Return in about 6 months (around 10/08/2019) for Obesity, Mood, HTN, Anemia.

## 2019-04-10 NOTE — Assessment & Plan Note (Signed)
Ongoing, initial elevated but repeat improved.  Recommend she check BP at home at least 3 mornings a week + continue focus on DASH diet and regular exercise with goal of modest weight loss.  Return in 6 months.  If consistent elevations noted consider Lisinopril or Losartan.  CMP today.

## 2019-04-10 NOTE — Assessment & Plan Note (Signed)
Chronic, exacerbated at this time due to Covid.  Continue Prozac daily and Buspar as needed.  Denies SI/HI.  Return in 6 months.

## 2019-04-10 NOTE — Assessment & Plan Note (Signed)
BMI 44.11.  Recommend continued focus on health diet choices and regular physical activity (30 minutes 5 days a week).

## 2019-04-10 NOTE — Patient Instructions (Signed)

## 2019-04-11 LAB — COMPREHENSIVE METABOLIC PANEL
ALT: 55 IU/L — ABNORMAL HIGH (ref 0–32)
AST: 26 IU/L (ref 0–40)
Albumin/Globulin Ratio: 1.5 (ref 1.2–2.2)
Albumin: 4.3 g/dL (ref 3.8–4.8)
Alkaline Phosphatase: 101 IU/L (ref 39–117)
BUN/Creatinine Ratio: 13 (ref 9–23)
BUN: 11 mg/dL (ref 6–24)
Bilirubin Total: <0.2 mg/dL (ref 0.0–1.2)
CO2: 26 mmol/L (ref 20–29)
Calcium: 9.4 mg/dL (ref 8.7–10.2)
Chloride: 102 mmol/L (ref 96–106)
Creatinine, Ser: 0.84 mg/dL (ref 0.57–1.00)
GFR calc Af Amer: 100 mL/min/{1.73_m2} (ref >59)
GFR calc non Af Amer: 87 mL/min/{1.73_m2} (ref >59)
Globulin, Total: 2.8 g/dL (ref 1.5–4.5)
Glucose: 107 mg/dL — ABNORMAL HIGH (ref 65–99)
Potassium: 3.9 mmol/L (ref 3.5–5.2)
Sodium: 139 mmol/L (ref 134–144)
Total Protein: 7.1 g/dL (ref 6.0–8.5)

## 2019-04-14 ENCOUNTER — Other Ambulatory Visit: Payer: BC Managed Care – PPO

## 2019-04-14 ENCOUNTER — Other Ambulatory Visit: Payer: Self-pay

## 2019-04-14 ENCOUNTER — Ambulatory Visit: Payer: BC Managed Care – PPO | Admitting: Nurse Practitioner

## 2019-04-14 ENCOUNTER — Encounter: Payer: Self-pay | Admitting: Nurse Practitioner

## 2019-04-14 VITALS — BP 140/86 | HR 87 | Temp 98.7°F | Ht 63.0 in | Wt 248.0 lb

## 2019-04-14 DIAGNOSIS — R1012 Left upper quadrant pain: Secondary | ICD-10-CM | POA: Insufficient documentation

## 2019-04-14 MED ORDER — METHOCARBAMOL 500 MG PO TABS: 500.0000 mg | ORAL_TABLET | Freq: Three times a day (TID) | ORAL | 0 refills | Status: DC | PRN

## 2019-04-14 NOTE — Patient Instructions (Signed)
IMAGING LOCATION: [address], [place], [place] [id number] [phone] Abdominal Pain, Adult  Many things can cause belly (abdominal) pain. Most times, belly pain is not dangerous. Many cases of belly pain can be watched and treated at home. Sometimes belly pain is serious, though. Your doctor will try to find the cause of your belly pain. Follow these instructions at home:  Take over-the-counter and prescription medicines only as told by your doctor. Do not take medicines that help you poop (laxatives) unless told to by your doctor.  Drink enough fluid to keep your pee (urine) clear or pale yellow.  Watch your belly pain for any changes.  Keep all follow-up visits as told by your doctor. This is important. Contact a doctor if:  Your belly pain changes or gets worse.  You are not hungry, or you lose weight without trying.  You are having trouble pooping (constipated) or have watery poop (diarrhea) for more than 2-3 days.  You have pain when you pee or poop.  Your belly pain wakes you up at night.  Your pain gets worse with meals, after eating, or with certain foods.  You are throwing up and cannot keep anything down.  You have a fever. Get help right away if:  Your pain does not go away as soon as your doctor says it should.  You cannot stop throwing up.  Your pain is only in areas of your belly, such as the right side or the left lower part of the belly.  You have bloody or black poop, or poop that looks like tar.  You have very bad pain, cramping, or bloating in your belly.  You have signs of not having enough fluid or water in your body (dehydration), such as: ? Dark pee, very little pee, or no pee. ? Cracked lips. ? Dry mouth. ? Sunken eyes. ? Sleepiness. ? Weakness. This information is not intended to replace advice given to you by your health care provider. Make sure you discuss any questions you have with your health care provider. Document  Released: 11/07/2007 Document Revised: 12/09/2015 Document Reviewed: 11/02/2015 Elsevier Interactive Patient Education  [employer].

## 2019-04-14 NOTE — Progress Notes (Signed)
BP 140/86 (BP Location: Left Arm, Patient Position: Sitting)   Pulse 87   Temp 98.7 F (37.1 C) (Oral)   Ht _0  (1.6 m)   Wt 248 lb (112.5 kg)   LMP 03/28/2019 (Approximate)   SpO2 98%   BMI 43.93 kg/m    Subjective:    [name], female    DOB: 08/08/1975, 43 y.o.   MRN: [id number]  HPI: Tamarra Geiselman is a 43 y.o. female  Chief Complaint  Patient presents with  . Abdominal Pain    upper left side x 2 days. mostly anytime. tried OTC ibuprofen   ABDOMINAL PAIN  Reports having abdominal pain to left upper abdomen, worse when she presses on it.  Noticed it yesterday.  Had belching with reflux with this.  Took Prilosec, but this did not help.  Reports reflux more yesterday afternoon and morning, but discomfort lasted all day.  Discomfort remains today, but no reflux.  She reports similar intermittent episodes over past week or two to same area, but noticed it more when leaning forward in chair at her workplace and not any other time + had no belching with it.  Had IUD placed on 04/07/2019, string check today report she did feel strings.  On regular basis has BM twice a day.  No straining, regular pattern.  Denies vaginal discharge or UTI symptoms. Duration:days Onset: gradual Severity: 3/10 Quality: aching Location:  LUQ  Episode duration:  Radiation: no Frequency: constant Alleviating factors:  Aggravating factors: Status: stable Treatments attempted: PPI Fever: no Nausea: no Vomiting: no Weight loss: no Decreased appetite: no Diarrhea: yesterday morning had x 4 loose stool, since then has returned to normal Constipation: no Blood in stool: no Heartburn: yes yesterday, but that has improved Jaundice: no Rash: no Dysuria/urinary frequency: no Hematuria: no History of sexually transmitted disease: no Recurrent NSAID use: no  Relevant past medical, surgical, family and social history reviewed and updated as indicated. Interim medical history  since our last visit reviewed. Allergies and medications reviewed and updated.  Review of Systems  Constitutional: Negative for activity change, appetite change, diaphoresis, fatigue and fever.  Respiratory: Negative for cough, chest tightness and shortness of breath.   Cardiovascular: Negative for chest pain, palpitations and leg swelling.  Gastrointestinal: Positive for abdominal pain (LUQ) and diarrhea (not resolved). Negative for abdominal distention, blood in stool, constipation, nausea and vomiting.  Endocrine: Negative for cold intolerance and heat intolerance.  Genitourinary: Negative.   Neurological: Negative for dizziness, syncope, weakness, light-headedness, numbness and headaches.  Psychiatric/Behavioral: Negative.     Per HPI unless specifically indicated above     Objective:    BP 140/86 (BP Location: Left Arm, Patient Position: Sitting)   Pulse 87   Temp 98.7 F (37.1 C) (Oral)   Ht _1  (1.6 m)   Wt 248 lb (112.5 kg)   LMP 03/28/2019 (Approximate)   SpO2 98%   BMI 43.93 kg/m   Wt Readings from Last 3 Encounters:  04/14/19 248 lb (112.5 kg)  04/10/19 249 lb (112.9 kg)  04/07/19 248 lb (112.5 kg)    Physical Exam Vitals signs and nursing note reviewed.  Constitutional:      General: She is awake. She is not in acute distress.    Appearance: She is well-developed. She is morbidly obese. She is not ill-appearing.  HENT:     Head: Normocephalic.     Right Ear: Hearing normal.     Left Ear: Hearing normal.  Eyes:     General: Lids are normal.        Right eye: No discharge.        Left eye: No discharge.     Conjunctiva/sclera: Conjunctivae normal.     Pupils: Pupils are equal, round, and reactive to light.  Neck:     Musculoskeletal: Normal range of motion and neck supple.     Vascular: No carotid bruit.  Cardiovascular:     Rate and Rhythm: Normal rate and regular rhythm.     Heart sounds: Normal heart sounds. No murmur. No gallop.   Pulmonary:      Effort: Pulmonary effort is normal. No accessory muscle usage or respiratory distress.     Breath sounds: Normal breath sounds.  Abdominal:     General: Bowel sounds are normal.     Palpations: Abdomen is soft. There is no hepatomegaly or splenomegaly.     Tenderness: There is abdominal tenderness in the left upper quadrant. There is no right CVA tenderness, left CVA tenderness, guarding or rebound.     Hernia: No hernia is present.    Musculoskeletal:     Right lower leg: No edema.     Left lower leg: No edema.  Skin:    General: Skin is warm and dry.  Neurological:     Mental Status: She is alert and oriented to person, place, and time.  Psychiatric:        Attention and Perception: Attention normal.        Mood and Affect: Mood normal.        Behavior: Behavior normal. Behavior is cooperative.        Thought Content: Thought content normal.        Judgment: Judgment normal.     Results for orders placed or performed in visit on 04/10/19  Comprehensive metabolic panel  Result Value Ref Range   Glucose 107 (H) 65 - 99 mg/dL   BUN 11 6 - 24 mg/dL   Creatinine, Ser 0.84 0.57 - 1.00 mg/dL   GFR calc non Af Amer 87 >59 mL/min/1.73   GFR calc Af Amer 100 >59 mL/min/1.73   BUN/Creatinine Ratio 13 9 - 23   Sodium 139 134 - 144 mmol/L   Potassium 3.9 3.5 - 5.2 mmol/L   Chloride 102 96 - 106 mmol/L   CO2 26 20 - 29 mmol/L   Calcium 9.4 8.7 - 10.2 mg/dL   Total Protein 7.1 6.0 - 8.5 g/dL   Albumin 4.3 3.8 - 4.8 g/dL   Globulin, Total 2.8 1.5 - 4.5 g/dL   Albumin/Globulin Ratio 1.5 1.2 - 2.2   Bilirubin Total <0.2 0.0 - 1.2 mg/dL   Alkaline Phosphatase 101 39 - 117 IU/L   AST 26 0 - 40 IU/L   ALT 55 (H) 0 - 32 IU/L      Assessment & Plan:   Problem List Items Addressed This Visit      Other   Left upper quadrant abdominal pain - Primary    Ongoing, noted more to pressure to area or leaning certain ways.  Will obtain CMP, lipase, amylase + obtain her future labs (anemia  panel and CBC) today.  U/S ordered for LUQ.  Script for Robaxin sent, trial to see if benefit from this.  Return in one week or sooner if worsening discomfort.      Relevant Orders   Comp Met (CMET)   Lipase   Amylase   US Abdomen Limited  Follow up plan: Return in about 1 week (around 04/21/2019) for abdominal pain follow-up.

## 2019-04-14 NOTE — Assessment & Plan Note (Addendum)
Ongoing, noted more to pressure to area or leaning certain ways.  Will obtain CMP, lipase, amylase + obtain her future labs (anemia panel and CBC) today.  U/S ordered for LUQ.  Script for Robaxin sent, trial to see if benefit from this.  Return in one week or sooner if worsening discomfort.

## 2019-04-15 ENCOUNTER — Encounter: Payer: Self-pay | Admitting: Obstetrics and Gynecology

## 2019-04-16 ENCOUNTER — Other Ambulatory Visit (INDEPENDENT_AMBULATORY_CARE_PROVIDER_SITE_OTHER): Payer: BC Managed Care – PPO

## 2019-04-16 ENCOUNTER — Encounter: Payer: Self-pay | Admitting: Obstetrics and Gynecology

## 2019-04-16 ENCOUNTER — Ambulatory Visit (INDEPENDENT_AMBULATORY_CARE_PROVIDER_SITE_OTHER): Payer: BC Managed Care – PPO | Admitting: Obstetrics and Gynecology

## 2019-04-16 ENCOUNTER — Other Ambulatory Visit: Payer: Self-pay

## 2019-04-16 VITALS — BP 126/90 | Ht 63.0 in | Wt 248.0 lb

## 2019-04-16 DIAGNOSIS — R102 Pelvic and perineal pain: Secondary | ICD-10-CM

## 2019-04-16 DIAGNOSIS — R1032 Left lower quadrant pain: Secondary | ICD-10-CM

## 2019-04-16 DIAGNOSIS — Z30431 Encounter for routine checking of intrauterine contraceptive device: Secondary | ICD-10-CM | POA: Diagnosis not present

## 2019-04-16 DIAGNOSIS — R350 Frequency of micturition: Secondary | ICD-10-CM

## 2019-04-16 DIAGNOSIS — Z30430 Encounter for insertion of intrauterine contraceptive device: Secondary | ICD-10-CM

## 2019-04-16 LAB — POCT URINALYSIS DIPSTICK
Bilirubin, UA: NEGATIVE
Blood, UA: NEGATIVE
Glucose, UA: NEGATIVE
Ketones, UA: NEGATIVE
Leukocytes, UA: NEGATIVE
Nitrite, UA: NEGATIVE
Protein, UA: NEGATIVE
Spec Grav, UA: 1.01 (ref 1.010–1.025)
pH, UA: 6 (ref 5.0–8.0)

## 2019-04-16 LAB — CBC WITH DIFFERENTIAL/PLATELET
Basophils Absolute: 0.1 10*3/uL (ref 0.0–0.2)
Basos: 1 %
EOS (ABSOLUTE): 0.2 10*3/uL (ref 0.0–0.4)
Eos: 2 %
Hemoglobin: 11.5 g/dL (ref 11.1–15.9)
Immature Grans (Abs): 0 10*3/uL (ref 0.0–0.1)
Immature Granulocytes: 0 %
Lymphocytes Absolute: 2.2 10*3/uL (ref 0.7–3.1)
Lymphs: 23 %
MCH: 26.3 pg — ABNORMAL LOW (ref 26.6–33.0)
MCHC: 31.8 g/dL (ref 31.5–35.7)
MCV: 83 fL (ref 79–97)
Monocytes Absolute: 0.5 10*3/uL (ref 0.1–0.9)
Monocytes: 5 %
Neutrophils Absolute: 6.6 10*3/uL (ref 1.4–7.0)
Neutrophils: 69 %
Platelets: 355 10*3/uL (ref 150–450)
RBC: 4.37 x10E6/uL (ref 3.77–5.28)
RDW: 13.6 % (ref 11.7–15.4)
WBC: 9.6 10*3/uL (ref 3.4–10.8)

## 2019-04-16 LAB — ANEMIA PANEL
Ferritin: 23 ng/mL (ref 15–150)
Folate, Hemolysate: 320 ng/mL
Folate, RBC: 884 ng/mL (ref >498)
Hematocrit: 36.2 % (ref 34.0–46.6)
Iron Saturation: 10 % — ABNORMAL LOW (ref 15–55)
Iron: 37 ug/dL (ref 27–159)
Retic Ct Pct: 1.5 % (ref 0.6–2.6)
Total Iron Binding Capacity: 354 ug/dL (ref 250–450)
UIBC: 317 ug/dL (ref 131–425)
Vitamin B-12: 379 pg/mL (ref 232–1245)

## 2019-04-16 NOTE — Progress Notes (Signed)
[name], NP   Chief Complaint  Patient presents with  . IUD check  . Pelvic Pain    urinary frequency and right lower back pain no burning or blood in urine since last night    HPI:      Ms. Shantera Ragains is a 43 y.o. G1P1001 who LMP was Patient's last menstrual period was 03/28/2019 (approximate)., presents today for LLQ pain for the past 5 days. Felt like ovulation pain initially but lasting longer than usual. Pain is achy and intermittent, improved yesterday but increased today. NSAIDs sometimes helpful. Hx of ovar cysts in distant past. Pt had Mirena inserted 04/07/19. Had spotting initially but that resolved. Pt also with urinary frequency/urgency for a few days, no dysuria/hematuria. Started having LBP and feeling hot today. Took NSAIDs with some improvement.  Hx of UTIs in past. No constipation/diarrhea sx.   Had been having LT rib pain and saw PCP a couple days ago. Given muscle relaxer which helped rib pain but not LLQ pain.   Had nausea this AM with hx of GERD.   Patient Active Problem List   Diagnosis Date Noted  . Left upper quadrant abdominal pain 04/14/2019  . Elevated BP without diagnosis of hypertension 04/10/2019  . Morbid obesity ([place]) 04/10/2019  . Iron deficiency anemia due to chronic blood loss 03/18/2019  . Menorrhagia with regular cycle 03/18/2019  . Leiomyoma 03/18/2019  . Allergic rhinitis 10/02/2017  . Asthma 07/22/2017  . GERD (gastroesophageal reflux disease) 07/22/2017  . PMDD (premenstrual dysphoric disorder) 10/11/2015  . PCOS (polycystic ovarian syndrome) 10/11/2015    Past Surgical History:  Procedure Laterality Date  . CESAREAN SECTION  01/04/2013  . DENTAL SURGERY      Family History  Problem Relation Age of Onset  . Hypertension Mother   . Arthritis Mother   . Asthma Mother   . Hypertension Sister   . Asthma Sister   . Arthritis Sister   . Polycystic ovary syndrome Sister   . Hypertension Maternal Grandmother   .  Kidney disease Maternal Grandmother   . GER disease Maternal Grandmother   . Heart disease Maternal Grandmother   . COPD Maternal Grandmother   . Alcohol abuse Maternal Grandfather   . Emphysema Maternal Grandfather   . Arthritis Sister   . Asthma Sister   . Polycystic ovary syndrome Sister     Social History   Socioeconomic History  . Marital status: Married    Spouse name: Not on file  . Number of children: Not on file  . Years of education: Not on file  . Highest education level: Not on file  Occupational History  . Not on file  Social Needs  . Financial resource strain: Not on file  . Food insecurity    Worry: Not on file    Inability: Not on file  . Transportation needs    Medical: Not on file    Non-medical: Not on file  Tobacco Use  . Smoking status: Never Smoker  . Smokeless tobacco: Never Used  Substance and Sexual Activity  . Alcohol use: Yes    Comment: socially  . Drug use: No  . Sexual activity: Yes    Birth control/protection: I.U.D.  Lifestyle  . Physical activity    Days per week: Not on file    Minutes per session: Not on file  . Stress: Not on file  Relationships  . Social connections    Talks on phone: Not on file  Gets together: Not on file    Attends religious service: Not on file    Active member of club or organization: Not on file    Attends meetings of clubs or organizations: Not on file    Relationship status: Not on file  . Intimate partner violence    Fear of current or ex partner: Not on file    Emotionally abused: Not on file    Physically abused: Not on file    Forced sexual activity: Not on file  Other Topics Concern  . Not on file  Social History Narrative  . Not on file    Outpatient Medications Prior to Visit  Medication Sig Dispense Refill  . busPIRone (BUSPAR) 5 MG tablet Take 1 tablet (5 mg total) by mouth 2 (two) times daily as needed (for anxiety). 60 tablet 3  . fexofenadine (ALLEGRA) 180 MG tablet Take 180 mg  by mouth daily.    Marland Kitchen FLUoxetine (PROZAC) 20 MG capsule TAKE 1 CAPSULE BY MOUTH EVERY DAY 90 capsule 3  . fluticasone (FLONASE) 50 MCG/ACT nasal spray Place 1 spray into both nostrils daily.    . methocarbamol (ROBAXIN) 500 MG tablet Take 1 tablet (500 mg total) by mouth every 8 (eight) hours as needed for muscle spasms. 30 tablet 0  . montelukast (SINGULAIR) 10 MG tablet Take 1 tablet (10 mg total) by mouth daily. 90 tablet 3  . omeprazole (PRILOSEC) 20 MG capsule Take 1 capsule (20 mg total) by mouth daily. 90 capsule 3   No facility-administered medications prior to visit.       ROS:  Review of Systems  Constitutional: Positive for fatigue. Negative for fever and unexpected weight change.  Respiratory: Positive for shortness of breath and wheezing. Negative for cough.   Cardiovascular: Negative for chest pain, palpitations and leg swelling.  Gastrointestinal: Positive for nausea. Negative for blood in stool, constipation, diarrhea and vomiting.  Endocrine: Negative for cold intolerance, heat intolerance and polyuria.  Genitourinary: Positive for frequency, pelvic pain and urgency. Negative for dyspareunia, dysuria, flank pain, genital sores, hematuria, menstrual problem, vaginal bleeding, vaginal discharge and vaginal pain.  Musculoskeletal: Negative for back pain, joint swelling and myalgias.  Skin: Negative for rash.  Neurological: Negative for dizziness, syncope, light-headedness, numbness and headaches.  Hematological: Negative for adenopathy.  Psychiatric/Behavioral: Positive for agitation and dysphoric mood. Negative for confusion, sleep disturbance and suicidal ideas. The patient is not nervous/anxious.      OBJECTIVE:   Vitals:  BP 126/90   Ht 5\' 3"  (1.6 m)   Wt 248 lb (112.5 kg)   LMP 03/28/2019 (Approximate)   BMI 43.93 kg/m   Physical Exam Vitals signs reviewed.  Constitutional:      Appearance: She is well-developed.  Neck:     Musculoskeletal: Normal range  of motion.  Pulmonary:     Effort: Pulmonary effort is normal.  Genitourinary:    General: Normal vulva.     Pubic Area: No rash.      Labia:        Right: No rash, tenderness or lesion.        Left: No rash, tenderness or lesion.      Vagina: Normal. No vaginal discharge, erythema or tenderness.     Cervix: Normal.     Uterus: Normal. Not enlarged and not tender.      Adnexa:        Right: Tenderness present. No mass.         Left: Tenderness present.  No mass.       Comments: IUD STRINGS IN CX OS Musculoskeletal: Normal range of motion.  Skin:    General: Skin is warm and dry.  Neurological:     General: No focal deficit present.     Mental Status: She is alert and oriented to person, place, and time.  Psychiatric:        Mood and Affect: Mood normal.        Behavior: Behavior normal.        Thought Content: Thought content normal.        Judgment: Judgment normal.     Results: Results for orders placed or performed in visit on 04/16/19 (from the past 24 hour(s))  POCT Urinalysis Dipstick     Status: Normal   Collection Time: 04/16/19  3:55 PM  Result Value Ref Range   Color, UA yellow    Clarity, UA clear    Glucose, UA Negative Negative   Bilirubin, UA neg    Ketones, UA neg    Spec Grav, UA 1.010 1.010 - 1.025   Blood, UA neg    pH, UA 6.0 5.0 - 8.0   Protein, UA Negative Negative   Urobilinogen, UA     Nitrite, UA neg    Leukocytes, UA Negative Negative   Appearance     Odor     ULTRASOUND REPORT  Location: [facility]  Date of Service: 04/16/2019     Indications:Pelvic Pain Findings:  The uterus is anteverted and measures 9.5 x 6.2 x 4.9 cm. Echo texture is heterogenous without evidence of focal masses. The Endometrium measures 5.8 mm. The IUD is correctly placed within the uterus.   Right Ovary measures 2.8 x 1.5 x 1.3 cm. It is normal in appearance. Left Ovary measures 3.8 x 2.6 x 2.4 cm. It is normal in appearance. Survey of the  adnexa demonstrates no adnexal masses. There is no free fluid in the cul de sac.  Impression: 1. Normal pelvic ultrasound.  2. The IUD is correctly placed within the uterus.   Recommendations: 1.Clinical correlation with the patient's History and Physical Exam.   Gweneth Dimitri, RT  Assessment/Plan: LLQ pain - Plan: US Transvaginal Non-OB; Slightly tender on exam. Neg GYN u/s, IUD in correct location. Rule out UTI with C&S. If neg, most likely due to new IUD placement and reassurance. NSAIDs/heating pad. F/u prn.   Encounter for routine checking of intrauterine contraceptive device (IUD) - Plan: US Transvaginal Non-OB  Urinary frequency - Plan: POCT Urinalysis Dipstick, Urine Culture-GYN; Neg UA. Check C&S. Will f/u if pos.     Return if symptoms worsen or fail to improve.  Josua Ferrebee B. Gwendola Hornaday, PA-C 04/16/2019 4:29 PM

## 2019-04-16 NOTE — Patient Instructions (Signed)
I value your feedback and entrusting us with your care. If you get a [facility] patient survey, I would appreciate you taking the time to let us know about your experience today. Thank you! 

## 2019-04-18 LAB — URINE CULTURE

## 2019-04-20 ENCOUNTER — Telehealth: Payer: Self-pay | Admitting: Obstetrics and Gynecology

## 2019-04-20 ENCOUNTER — Other Ambulatory Visit: Payer: Self-pay

## 2019-04-20 NOTE — Telephone Encounter (Signed)
Spoke with pt. Cramping/spotting not as bad now. Had IUD placed 04/07/19. Had neg GYN u/s and C&S 04/16/19. Reassurance. F/u prn.

## 2019-04-21 ENCOUNTER — Ambulatory Visit: Payer: BC Managed Care – PPO | Admitting: Nurse Practitioner

## 2019-04-21 ENCOUNTER — Other Ambulatory Visit: Payer: Self-pay

## 2019-04-21 ENCOUNTER — Encounter: Payer: Self-pay | Admitting: Nurse Practitioner

## 2019-04-21 DIAGNOSIS — R1012 Left upper quadrant pain: Secondary | ICD-10-CM | POA: Diagnosis not present

## 2019-04-21 NOTE — Assessment & Plan Note (Signed)
Improving at this time.  Suspect related to recent IUD placement.  Continue tylenol and muscle relaxer as needed.  Return for worsening or continued pain.

## 2019-04-21 NOTE — Patient Instructions (Addendum)
[facility] BLOOD PRESSURE CUFF   DASH Eating Plan DASH stands for "Dietary Approaches to Stop Hypertension." The DASH eating plan is a healthy eating plan that has been shown to reduce high blood pressure (hypertension). It may also reduce your risk for type 2 diabetes, heart disease, and stroke. The DASH eating plan may also help with weight loss. What are tips for following this plan?  General guidelines  Avoid eating more than 2,300 mg (milligrams) of salt (sodium) a day. If you have hypertension, you may need to reduce your sodium intake to 1,500 mg a day.  Limit alcohol intake to no more than 1 drink a day for nonpregnant women and 2 drinks a day for men. One drink equals 12 oz of beer, 5 oz of wine, or 1 oz of hard liquor.  Work with your health care provider to maintain a healthy body weight or to lose weight. Ask what an ideal weight is for you.  Get at least 30 minutes of exercise that causes your heart to beat faster (aerobic exercise) most days of the week. Activities may include walking, swimming, or biking.  Work with your health care provider or diet and nutrition specialist (dietitian) to adjust your eating plan to your individual calorie needs. Reading food labels   Check food labels for the amount of sodium per serving. Choose foods with less than 5 percent of the Daily Value of sodium. Generally, foods with less than 300 mg of sodium per serving fit into this eating plan.  To find whole grains, look for the word "whole" as the first word in the ingredient list. Shopping  Buy products labeled as "low-sodium" or "no salt added."  Buy fresh foods. Avoid canned foods and premade or frozen meals. Cooking  Avoid adding salt when cooking. Use salt-free seasonings or herbs instead of table salt or sea salt. Check with your health care provider or pharmacist before using salt substitutes.  Do not fry foods. Cook foods using healthy methods such as baking, boiling, grilling, and  broiling instead.  Cook with heart-healthy oils, such as olive, canola, soybean, or sunflower oil. Meal planning  Eat a balanced diet that includes: ? 5 or more servings of fruits and vegetables each day. At each meal, try to fill half of your plate with fruits and vegetables. ? Up to 6-8 servings of whole grains each day. ? Less than 6 oz of lean meat, poultry, or fish each day. A 3-oz serving of meat is about the same size as a deck of cards. One egg equals 1 oz. ? 2 servings of low-fat dairy each day. ? A serving of nuts, seeds, or beans 5 times each week. ? Heart-healthy fats. Healthy fats called Omega-3 fatty acids are found in foods such as flaxseeds and coldwater fish, like sardines, salmon, and mackerel.  Limit how much you eat of the following: ? Canned or prepackaged foods. ? Food that is high in trans fat, such as fried foods. ? Food that is high in saturated fat, such as fatty meat. ? Sweets, desserts, sugary drinks, and other foods with added sugar. ? Full-fat dairy products.  Do not salt foods before eating.  Try to eat at least 2 vegetarian meals each week.  Eat more home-cooked food and less restaurant, buffet, and fast food.  When eating at a restaurant, ask that your food be prepared with less salt or no salt, if possible. What foods are recommended? The items listed may not be a complete  list. Talk with your dietitian about what dietary choices are best for you. Grains Whole-grain or whole-wheat bread. Whole-grain or whole-wheat pasta. Brown rice. [place]row. Bulgur. Whole-grain and low-sodium cereals. Pita bread. Low-fat, low-sodium crackers. Whole-wheat flour tortillas. Vegetables Fresh or frozen vegetables (raw, steamed, roasted, or grilled). Low-sodium or reduced-sodium tomato and vegetable juice. Low-sodium or reduced-sodium tomato sauce and tomato paste. Low-sodium or reduced-sodium canned vegetables. Fruits All fresh, dried, or frozen fruit. Canned  fruit in natural juice (without added sugar). Meat and other protein foods Skinless chicken or [place]. Ground chicken or [place]. Pork with fat trimmed off. Fish and seafood. Egg whites. Dried beans, peas, or lentils. Unsalted nuts, nut butters, and seeds. Unsalted canned beans. Lean cuts of beef with fat trimmed off. Low-sodium, lean deli meat. Dairy Low-fat (1%) or fat-free (skim) milk. Fat-free, low-fat, or reduced-fat cheeses. Nonfat, low-sodium ricotta or cottage cheese. Low-fat or nonfat yogurt. Low-fat, low-sodium cheese. Fats and oils Soft margarine without trans fats. Vegetable oil. Low-fat, reduced-fat, or light mayonnaise and salad dressings (reduced-sodium). Canola, safflower, olive, soybean, and sunflower oils. Avocado. Seasoning and other foods Herbs. Spices. Seasoning mixes without salt. Unsalted popcorn and pretzels. Fat-free sweets. What foods are not recommended? The items listed may not be a complete list. Talk with your dietitian about what dietary choices are best for you. Grains Baked goods made with fat, such as croissants, muffins, or some breads. Dry pasta or rice meal packs. Vegetables Creamed or fried vegetables. Vegetables in a cheese sauce. Regular canned vegetables (not low-sodium or reduced-sodium). Regular canned tomato sauce and paste (not low-sodium or reduced-sodium). Regular tomato and vegetable juice (not low-sodium or reduced-sodium). Angie Fava. Olives. Fruits Canned fruit in a light or heavy syrup. Fried fruit. Fruit in cream or butter sauce. Meat and other protein foods Fatty cuts of meat. Ribs. Fried meat. Berniece Salines. Sausage. Bologna and other processed lunch meats. Salami. Fatback. Hotdogs. Bratwurst. Salted nuts and seeds. Canned beans with added salt. Canned or smoked fish. Whole eggs or egg yolks. Chicken or [place] with skin. Dairy Whole or 2% milk, cream, and half-and-half. Whole or full-fat cream cheese. Whole-fat or sweetened yogurt. Full-fat cheese.  Nondairy creamers. Whipped toppings. Processed cheese and cheese spreads. Fats and oils Butter. Stick margarine. Lard. Shortening. Ghee. Bacon fat. Tropical oils, such as coconut, palm kernel, or palm oil. Seasoning and other foods Salted popcorn and pretzels. Onion salt, garlic salt, seasoned salt, table salt, and sea salt. Worcestershire sauce. Tartar sauce. Barbecue sauce. Teriyaki sauce. Soy sauce, including reduced-sodium. Steak sauce. Canned and packaged gravies. Fish sauce. Oyster sauce. Cocktail sauce. Horseradish that you find on the shelf. Ketchup. Mustard. Meat flavorings and tenderizers. Bouillon cubes. Hot sauce and Tabasco sauce. Premade or packaged marinades. Premade or packaged taco seasonings. Relishes. Regular salad dressings. Where to find more information:  National Heart, Lung, and Mark: https://wilson-eaton.com/  American Heart Association: www.heart.org Summary  The DASH eating plan is a healthy eating plan that has been shown to reduce high blood pressure (hypertension). It may also reduce your risk for type 2 diabetes, heart disease, and stroke.  With the DASH eating plan, you should limit salt (sodium) intake to 2,300 mg a day. If you have hypertension, you may need to reduce your sodium intake to 1,500 mg a day.  When on the DASH eating plan, aim to eat more fresh fruits and vegetables, whole grains, lean proteins, low-fat dairy, and heart-healthy fats.  Work with your health care provider or diet and nutrition specialist (dietitian) to  adjust your eating plan to your individual calorie needs. This information is not intended to replace advice given to you by your health care provider. Make sure you discuss any questions you have with your health care provider. Document Released: 05/10/2011 Document Revised: 05/03/2017 Document Reviewed: 05/14/2016 Elsevier Patient Education  2020 [employer].

## 2019-04-21 NOTE — Progress Notes (Signed)
BP 138/84 (BP Location: Left Arm, Patient Position: Sitting)   Pulse 90   Temp 98.2 F (36.8 C) (Oral)   Ht 5\' 3"  (1.6 m)   Wt 251 lb (113.9 kg)   LMP 03/28/2019 (Approximate)   SpO2 97%   BMI 44.46 kg/m    Subjective:    [name], female    DOB: 1976/04/22, 43 y.o.   MRN: [id number]  HPI: Sydney White is a 43 y.o. female  Chief Complaint  Patient presents with  . Follow-up  . Abdominal Pain   ABDOMINAL PAIN FOLLOW-UP: Attended visit with GYN, they suspect discomfort from recent Mirena IUD and placement was correct on imaging.  Reports she is having reduction in cramping and small spotting this morning.  She reports overall the discomfort is improving.   Duration:days Fever: no Nausea: no Vomiting: no Weight loss: no Decreased appetite: no Diarrhea: yesterday morning had x 4 loose stool, since then has returned to normal Constipation: no Blood in stool: no Heartburn: yes yesterday, but that has improved Jaundice: no Rash: no Dysuria/urinary frequency: no Hematuria: no History of sexually transmitted disease: no Recurrent NSAID use: no  Relevant past medical, surgical, family and social history reviewed and updated as indicated. Interim medical history since our last visit reviewed. Allergies and medications reviewed and updated.  Review of Systems  Constitutional: Negative for activity change, appetite change, diaphoresis, fatigue and fever.  Respiratory: Negative for cough, chest tightness and shortness of breath.   Cardiovascular: Negative for chest pain, palpitations and leg swelling.  Gastrointestinal: Negative for abdominal distention, abdominal pain, constipation, diarrhea, nausea and vomiting.  Psychiatric/Behavioral: Negative.     Per HPI unless specifically indicated above     Objective:    BP 138/84 (BP Location: Left Arm, Patient Position: Sitting)   Pulse 90   Temp 98.2 F (36.8 C) (Oral)   Ht 5\' 3"  (1.6 m)   Wt  251 lb (113.9 kg)   LMP 03/28/2019 (Approximate)   SpO2 97%   BMI 44.46 kg/m   Wt Readings from Last 3 Encounters:  04/21/19 251 lb (113.9 kg)  04/16/19 248 lb (112.5 kg)  04/14/19 248 lb (112.5 kg)    Physical Exam Vitals signs and nursing note reviewed.  Constitutional:      General: She is awake. She is not in acute distress.    Appearance: She is well-developed. She is morbidly obese. She is not ill-appearing.  HENT:     Head: Normocephalic.     Right Ear: Hearing normal.     Left Ear: Hearing normal.  Eyes:     General: Lids are normal.        Right eye: No discharge.        Left eye: No discharge.     Conjunctiva/sclera: Conjunctivae normal.     Pupils: Pupils are equal, round, and reactive to light.  Neck:     Musculoskeletal: Normal range of motion and neck supple.     Vascular: No carotid bruit.  Cardiovascular:     Rate and Rhythm: Normal rate and regular rhythm.     Heart sounds: Normal heart sounds. No murmur. No gallop.   Pulmonary:     Effort: Pulmonary effort is normal. No accessory muscle usage or respiratory distress.     Breath sounds: Normal breath sounds.  Abdominal:     General: Bowel sounds are normal.     Palpations: Abdomen is soft. There is no hepatomegaly or splenomegaly.  Tenderness: There is no abdominal tenderness. There is no right CVA tenderness, left CVA tenderness, guarding or rebound.     Hernia: No hernia is present.  Musculoskeletal:     Right lower leg: No edema.     Left lower leg: No edema.  Skin:    General: Skin is warm and dry.  Neurological:     Mental Status: She is alert and oriented to person, place, and time.  Psychiatric:        Attention and Perception: Attention normal.        Mood and Affect: Mood normal.        Behavior: Behavior normal. Behavior is cooperative.        Thought Content: Thought content normal.        Judgment: Judgment normal.     Results for orders placed or performed in visit on 04/16/19   Urine Culture-GYN   Specimen: Urine, Clean Catch   UR  Result Value Ref Range   Urine Culture, Routine Final report    Organism ID, Bacteria Comment   POCT Urinalysis Dipstick  Result Value Ref Range   Color, UA yellow    Clarity, UA clear    Glucose, UA Negative Negative   Bilirubin, UA neg    Ketones, UA neg    Spec Grav, UA 1.010 1.010 - 1.025   Blood, UA neg    pH, UA 6.0 5.0 - 8.0   Protein, UA Negative Negative   Urobilinogen, UA     Nitrite, UA neg    Leukocytes, UA Negative Negative   Appearance     Odor        Assessment & Plan:   Problem List Items Addressed This Visit      Other   Left upper quadrant abdominal pain    Improving at this time.  Suspect related to recent IUD placement.  Continue tylenol and muscle relaxer as needed.  Return for worsening or continued pain.          Follow up plan: Return as scheduled in May.

## 2019-04-24 ENCOUNTER — Ambulatory Visit: Payer: BC Managed Care – PPO

## 2019-05-05 ENCOUNTER — Ambulatory Visit: Payer: BC Managed Care – PPO | Admitting: Obstetrics & Gynecology

## 2019-05-05 ENCOUNTER — Ambulatory Visit: Payer: BC Managed Care – PPO

## 2019-05-11 ENCOUNTER — Other Ambulatory Visit: Payer: Self-pay

## 2019-05-11 IMAGING — DX DG CHEST 2V
2 series · 2 of 2 positions shown · non-contrast
Comparison: None.

CLINICAL DATA: Cough

EXAM:
CHEST  2 VIEW

[chest pa]
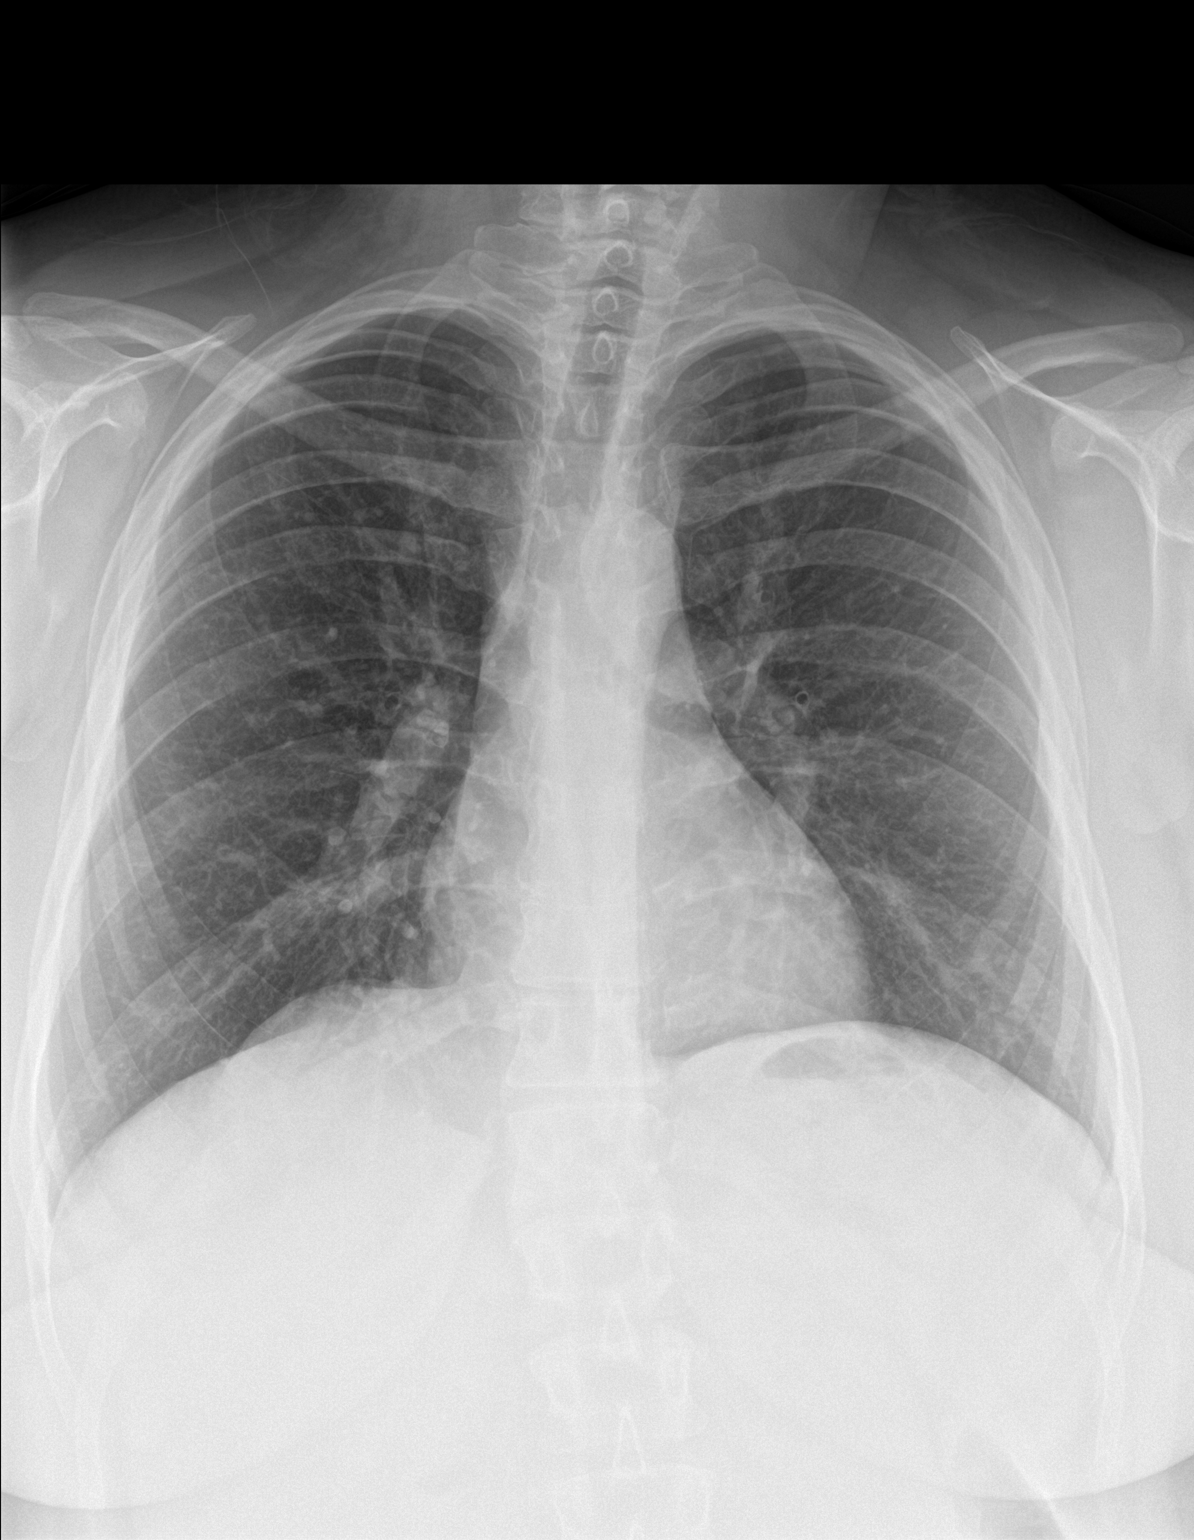

[chest lat]
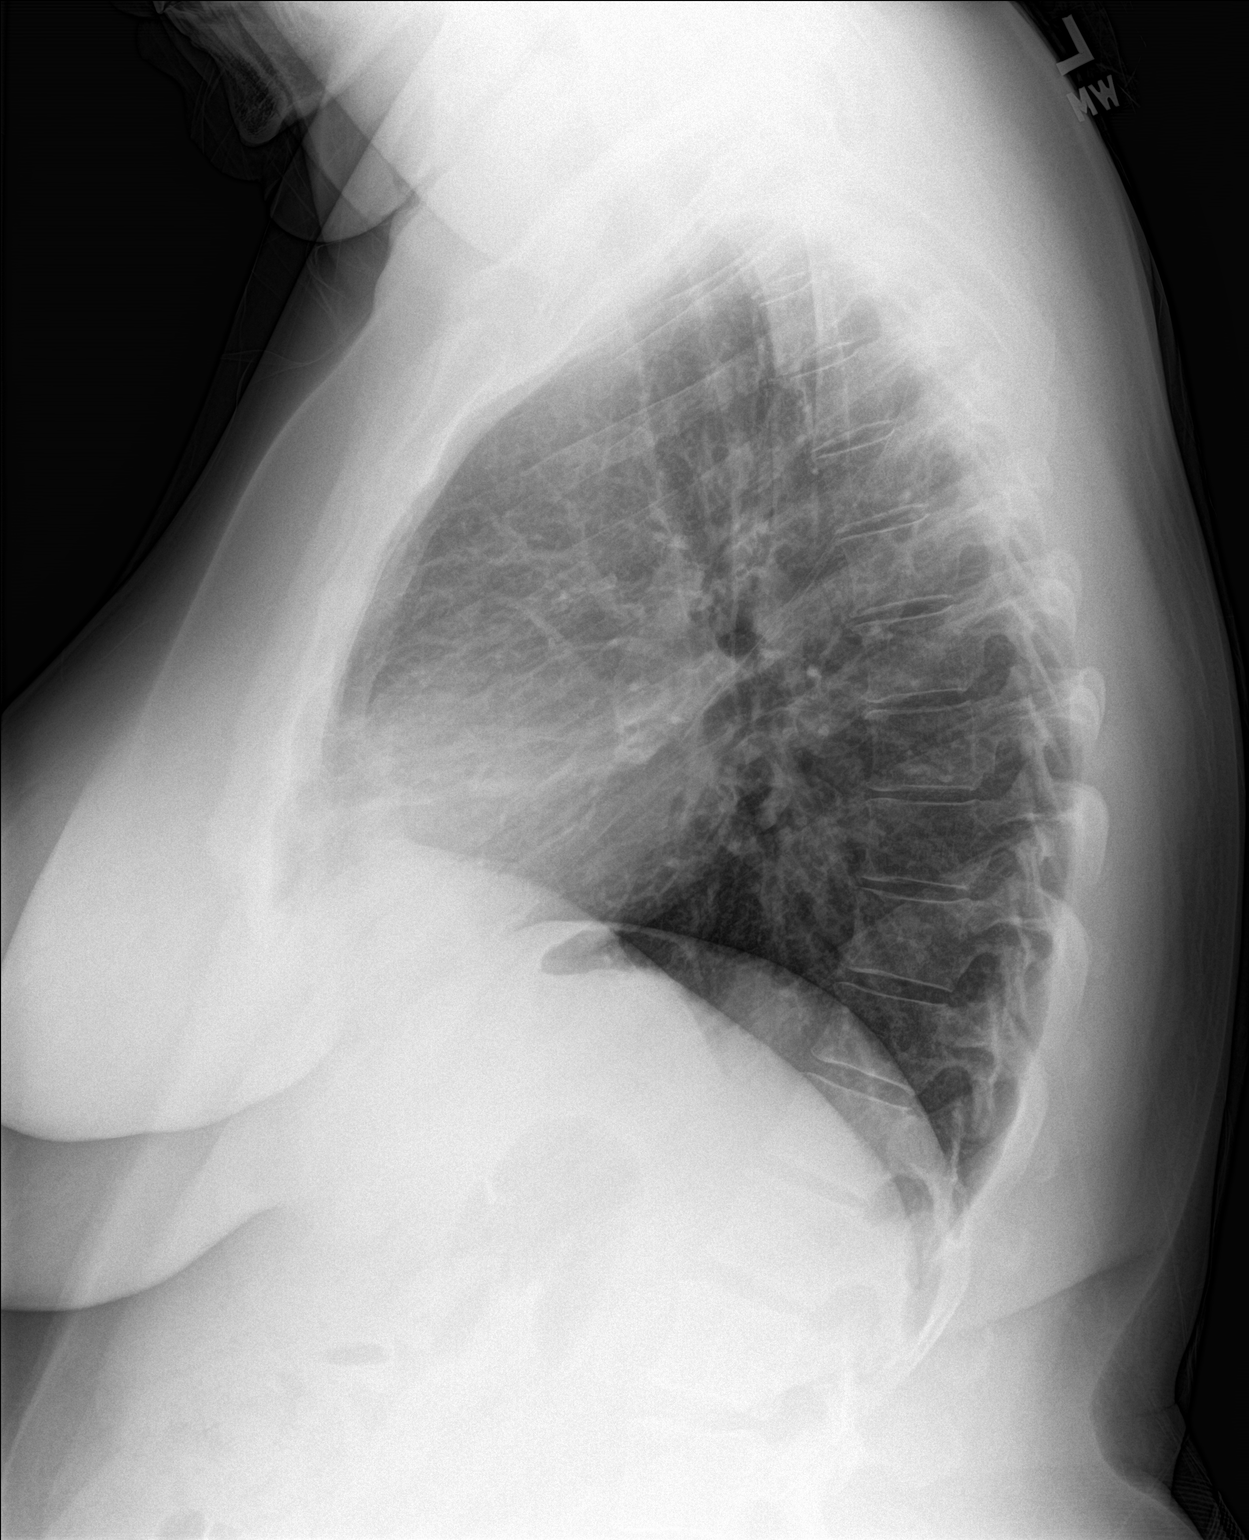

[2 of 2 positions shown; findings below may reference images not displayed]

FINDINGS: Cardiac shadow is within normal limits. The lungs are well aerated
bilaterally. Minimal atelectatic changes in the lingula are seen. No
focal confluent infiltrate or sizable effusion is noted.
IMPRESSION: Minimal lingular atelectasis.

## 2019-05-11 MED ORDER — OMEPRAZOLE 20 MG PO CPDR: 20.0000 mg | DELAYED_RELEASE_CAPSULE | Freq: Every day | ORAL | 3 refills | Status: DC

## 2019-05-11 MED ORDER — MONTELUKAST SODIUM 10 MG PO TABS: 10.0000 mg | ORAL_TABLET | Freq: Every day | ORAL | 3 refills | Status: DC

## 2019-06-01 ENCOUNTER — Ambulatory Visit: Payer: BC Managed Care – PPO

## 2019-06-01 ENCOUNTER — Ambulatory Visit: Payer: BC Managed Care – PPO | Admitting: Obstetrics & Gynecology

## 2019-06-18 ENCOUNTER — Other Ambulatory Visit: Payer: Self-pay

## 2019-06-18 ENCOUNTER — Ambulatory Visit: Payer: BC Managed Care – PPO | Admitting: Nurse Practitioner

## 2019-06-18 ENCOUNTER — Encounter: Payer: Self-pay | Admitting: Nurse Practitioner

## 2019-06-18 VITALS — BP 170/113 | HR 89 | Temp 98.4°F

## 2019-06-18 DIAGNOSIS — R030 Elevated blood-pressure reading, without diagnosis of hypertension: Secondary | ICD-10-CM

## 2019-06-18 DIAGNOSIS — M25512 Pain in left shoulder: Secondary | ICD-10-CM | POA: Insufficient documentation

## 2019-06-18 MED ORDER — KETOROLAC TROMETHAMINE 30 MG/ML IJ SOLN
60.0000 mg | Freq: Once | INTRAMUSCULAR | Status: AC
  Administered 2019-06-18: 60 mg via INTRAMUSCULAR

## 2019-06-18 NOTE — Assessment & Plan Note (Addendum)
Physical examination with no red flags. Will treat conservatively. Toradol injection given in office today for pain. Patient instructed not to take Ibuprofen for the next 24 hours. Continue ice and elevation. Start stretches. May need referral to PT and/or orthopedic consult if symptoms persist greater than 4 weeks. Shared [facility] with patient if pain becomes more persistent. If pain becomes sudden or severe, return to clinic or go to ED.

## 2019-06-18 NOTE — Assessment & Plan Note (Addendum)
Shoulder pain likely cause of elevated blood pressure today.Patient will monitor at home and call back to clinic if remains elevated.

## 2019-06-18 NOTE — Patient Instructions (Signed)
Shoulder Range of Motion Exercises Shoulder range of motion (ROM) exercises are done to keep the shoulder moving freely or to increase movement. They are often recommended for people who have shoulder pain or stiffness or who are recovering from a shoulder surgery. Phase 1 exercises When you are able, do this exercise 1-2 times per day for 30-60 seconds in each direction, or as directed by your health care provider. Pendulum exercise To do this exercise while sitting: 1. Sit in a chair or at the edge of your bed with your feet flat on the floor. 2. Let your affected arm hang down in front of you over the edge of the bed or chair. 3. Relax your shoulder, arm, and hand. 4. Rock your body so your arm gently swings in small circles. You can also use your unaffected arm to start the motion. 5. Repeat changing the direction of the circles, swinging your arm left and right, and swinging your arm forward and back. To do this exercise while standing: 1. Stand next to a sturdy chair or table, and hold on to it with your hand on your unaffected side. 2. Bend forward at the waist. 3. Bend your knees slightly. 4. Relax your shoulder, arm, and hand. 5. While keeping your shoulder relaxed, use body motion to swing your arm in small circles. 6. Repeat changing the direction of the circles, swinging your arm left and right, and swinging your arm forward and back. 7. Between exercises, stand up tall and take a short break to relax your lower back.  Phase 2 exercises Do these exercises 1-2 times per day or as told by your health care provider. Hold each stretch for 30 seconds, and repeat 3 times. Do the exercises with one or both arms as instructed by your health care provider. For these exercises, sit at a table with your hand and arm supported by the table. A chair that slides easily or has wheels can be helpful. External rotation 1. Turn your chair so that your affected side is nearest to the  table. 2. Place your forearm on the table to your side. Bend your elbow about 90 at the elbow (right angle) and place your hand palm facing down on the table. Your elbow should be about 6 inches away from your side. 3. Keeping your arm on the table, lean your body forward. Abduction 1. Turn your chair so that your affected side is nearest to the table. 2. Place your forearm and hand on the table so that your thumb points toward the ceiling and your arm is straight out to your side. 3. Slide your hand out to the side and away from you, using your unaffected arm to do the work. 4. To increase the stretch, you can slide your chair away from the table. Flexion: forward stretch 1. Sit facing the table. Place your hand and elbow on the table in front of you. 2. Slide your hand forward and away from you, using your unaffected arm to do the work. 3. To increase the stretch, you can slide your chair backward. Phase 3 exercises Do these exercises 1-2 times per day or as told by your health care provider. Hold each stretch for 30 seconds, and repeat 3 times. Do the exercises with one or both arms as instructed by your health care provider. Cross-body stretch: posterior capsule stretch 1. Lift your arm straight out in front of you. 2. Bend your arm 90 at the elbow (right angle) so your forearm   moves across your body. 3. Use your other arm to gently pull the elbow across your body, toward your other shoulder. Wall climbs 1. Stand with your affected arm extended out to the side with your hand resting on a door frame. 2. Slide your hand slowly up the door frame. 3. To increase the stretch, step through the door frame. Keep your body upright and do not lean. Wand exercises You will need a cane, a piece of PVC pipe, or a sturdy wooden dowel for wand exercises. Flexion To do this exercise while standing: 1. Hold the wand with both of your hands, palms down. 2. Using the other arm to help, lift your arms  up and over your head, if able. 3. Push upward with your other arm to gently increase the stretch. To do this exercise while lying down: 1. Lie on your back with your elbows resting on the floor and the wand in both your hands. Your hands will be palm down, or pointing toward your feet. 2. Lift your hands toward the ceiling, using your unaffected arm to help if needed. 3. Bring your arms overhead as able, using your unaffected arm to help if needed. Internal rotation 1. Stand while holding the wand behind you with both hands. Your unaffected arm should be extended above your head with the arm of the affected side extended behind you at the level of your waist. The wand should be pointing straight up and down as you hold it. 2. Slowly pull the wand up behind your back by straightening the elbow of your unaffected arm and bending the elbow of your affected arm. External rotation 1. Lie on your back with your affected upper arm supported on a small pillow or rolled towel. When you first do this exercise, keep your upper arm close to your body. Over time, bring your arm up to a 90 angle out to the side. 2. Hold the wand across your stomach and with both hands palm up. Your elbow on your affected side should be bent at a 90 angle. 3. Use your unaffected side to help push your forearm away from you and toward the floor. Keep your elbow on your affected side bent at a 90 angle. Contact a health care provider if you have:  New or increasing pain.  New numbness, tingling, weakness, or discoloration in your arm or hand. This information is not intended to replace advice given to you by your health care provider. Make sure you discuss any questions you have with your health care provider. Document Revised: 07/03/2017 Document Reviewed: 07/03/2017 Elsevier Patient Education  2020 [employer]  

## 2019-06-18 NOTE — Progress Notes (Signed)
BP (!) 170/113 (BP Location: Right Arm, Patient Position: Sitting, Cuff Size: Normal)   Pulse 89   Temp 98.4 F (36.9 C) (Oral)   SpO2 97%    Subjective:    [name], female    DOB: 1975/07/19, 44 y.o.   MRN: [id number]  HPI: Sydney White is a 44 y.o. female  Chief Complaint  Patient presents with  . Pain    Left neck/shouldre. Woke up stiff sunday morning, sat at computer for an hour then big pop when pt stood up. Pop made pt light headed and was painful. rest of day pt was fine. 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 Pt went down stairs to sit up used and ice pack and ibuprofen to releive the pain. Pain is excruciating stabbing pain when pt moves, psin is absent when arm is stabilized. Pt states she can feel heat from the area.    NECK/SHOULDER PAIN Onset: 06/14/2019 - woke up with stiff neck and shoulders; stood up and stretched and heard popping in neck, got flushed, and saw stars; pain resolved after but since has waxed and waned Duration: days Involved shoulder: left Mechanism of injury: trauma Location: diffuse, all around joint from back to front Onset:waxes and wanes Severity: 5/10 with activity Quality:  sharp Frequency: with activity constant Radiation: no Aggravating factors: movement, laying down flat Alleviating factors: ice and NSAIDs, elevation Status: better Treatments attempted: rest, ice and ibuprofen  Relief with NSAIDs?:  moderate Weakness: no Numbness/tingling: yes Decreased grip strength: no Redness: no Swelling: no Bruising: no Fevers: no   Allergies  Allergen Reactions  . Sulfa Antibiotics Hives   Outpatient Encounter Medications as of 06/18/2019  Medication Sig  . fexofenadine (ALLEGRA) 180 MG tablet Take 180 mg by mouth daily.  Marland Kitchen FLUoxetine (PROZAC) 20 MG capsule TAKE 1 CAPSULE BY MOUTH EVERY DAY  . fluticasone (FLONASE) 50 MCG/ACT nasal spray Place 1 spray  into both nostrils daily.  . IBUPROFEN PO Take by mouth as needed.  . montelukast (SINGULAIR) 10 MG tablet Take 1 tablet (10 mg total) by mouth daily.  Marland Kitchen omeprazole (PRILOSEC) 20 MG capsule Take 1 capsule (20 mg total) by mouth daily.  . busPIRone (BUSPAR) 5 MG tablet Take 1 tablet (5 mg total) by mouth 2 (two) times daily as needed (for anxiety). (Patient not taking: Reported on 06/18/2019)  . methocarbamol (ROBAXIN) 500 MG tablet Take 1 tablet (500 mg total) by mouth every 8 (eight) hours as needed for muscle spasms. (Patient not taking: Reported on 06/18/2019)  . [EXPIRED] ketorolac (TORADOL) 30 MG/ML injection 60 mg    No facility-administered encounter medications on file as of 06/18/2019.   Patient Active Problem List   Diagnosis Date Noted  . Acute pain of left shoulder 06/18/2019  . Left upper quadrant abdominal pain 04/14/2019  . Elevated BP without diagnosis of hypertension 04/10/2019  . Morbid obesity ([place]) 04/10/2019  . Iron deficiency anemia due to chronic blood loss 03/18/2019  . Menorrhagia with regular cycle 03/18/2019  . Leiomyoma 03/18/2019  . Allergic rhinitis 10/02/2017  . Asthma 07/22/2017  . GERD (gastroesophageal reflux disease) 07/22/2017  . PMDD (premenstrual dysphoric disorder) 10/11/2015  . PCOS (polycystic ovarian syndrome) 10/11/2015   Past Medical History:  Diagnosis Date  . Allergy   . Anxiety   . Asthma   . GERD (gastroesophageal reflux disease)   . Hypertension   . PCOS (polycystic ovarian  syndrome)   . PMDD (premenstrual dysphoric disorder)     Review of Systems  Constitutional: Negative.  Negative for diaphoresis, fatigue and fever.  HENT: Negative.   Respiratory: Negative.   Cardiovascular: Negative.   Musculoskeletal: Positive for arthralgias (left shoulder), joint swelling (left shoulder), neck pain and neck stiffness.  Skin: Negative.  Negative for color change and rash.  Neurological: Negative.  Negative for dizziness, weakness and  headaches.  Psychiatric/Behavioral: Negative.     Per HPI unless specifically indicated above     Objective:    BP (!) 170/113 (BP Location: Right Arm, Patient Position: Sitting, Cuff Size: Normal)   Pulse 89   Temp 98.4 F (36.9 C) (Oral)   SpO2 97%   Wt Readings from Last 3 Encounters:  04/21/19 251 lb (113.9 kg)  04/16/19 248 lb (112.5 kg)  04/14/19 248 lb (112.5 kg)    Physical Exam Vitals and nursing note reviewed.  Constitutional:      General: She is not in acute distress.    Appearance: Normal appearance. She is normal weight. She is not ill-appearing, toxic-appearing or diaphoretic.  HENT:     Head: Normocephalic and atraumatic.     Right Ear: External ear normal.     Left Ear: External ear normal.     Mouth/Throat:     Mouth: Mucous membranes are moist.     Pharynx: Oropharynx is clear.  Eyes:     General: No scleral icterus.    Extraocular Movements: Extraocular movements intact.  Pulmonary:     Effort: Pulmonary effort is normal. No respiratory distress.  Musculoskeletal:     Right shoulder: Normal.     Left shoulder: Tenderness and bony tenderness present. No swelling, deformity or effusion. Decreased range of motion. Normal strength. Normal pulse.     Cervical back: Normal range of motion and neck supple. No rigidity or tenderness.  Skin:    General: Skin is warm and dry.     Capillary Refill: Capillary refill takes less than 2 seconds.     Coloration: Skin is not jaundiced.     Findings: No bruising or erythema.  Neurological:     General: No focal deficit present.     Mental Status: She is alert and oriented to person, place, and time.     Motor: No weakness.     Gait: Gait normal.  Psychiatric:        Mood and Affect: Mood normal.        Behavior: Behavior normal.        Thought Content: Thought content normal.        Judgment: Judgment normal.     Results for orders placed or performed in visit on 04/16/19  Urine Culture-GYN   Specimen:  Urine, Clean Catch   UR  Result Value Ref Range   Urine Culture, Routine Final report    Organism ID, Bacteria Comment   POCT Urinalysis Dipstick  Result Value Ref Range   Color, UA yellow    Clarity, UA clear    Glucose, UA Negative Negative   Bilirubin, UA neg    Ketones, UA neg    Spec Grav, UA 1.010 1.010 - 1.025   Blood, UA neg    pH, UA 6.0 5.0 - 8.0   Protein, UA Negative Negative   Urobilinogen, UA     Nitrite, UA neg    Leukocytes, UA Negative Negative   Appearance     Odor  Assessment & Plan:   Problem List Items Addressed This Visit      Other   Elevated BP without diagnosis of hypertension    Shoulder pain likely cause of elevated blood pressure today.Patient will monitor at home and call back to clinic if remains elevated.      Acute pain of left shoulder - Primary    Physical examination with no red flags. Will treat conservatively. Toradol injection given in office today for pain. Patient instructed not to take Ibuprofen for the next 24 hours. Continue ice and elevation. Start stretches. May need referral to PT and/or orthopedic consult if symptoms persist greater than 4 weeks. Shared [facility] with patient if pain becomes more persistent. If pain becomes sudden or severe, return to clinic or go to ED.           Follow up plan: Return if symptoms worsen or fail to improve.

## 2019-09-18 ENCOUNTER — Ambulatory Visit (INDEPENDENT_AMBULATORY_CARE_PROVIDER_SITE_OTHER): Payer: BC Managed Care – PPO | Admitting: Nurse Practitioner

## 2019-09-18 ENCOUNTER — Other Ambulatory Visit: Payer: Self-pay

## 2019-09-18 ENCOUNTER — Encounter: Payer: Self-pay | Admitting: Nurse Practitioner

## 2019-09-18 VITALS — BP 118/78 | HR 97 | Temp 98.5°F

## 2019-09-18 DIAGNOSIS — J4521 Mild intermittent asthma with (acute) exacerbation: Secondary | ICD-10-CM | POA: Diagnosis not present

## 2019-09-18 DIAGNOSIS — R0683 Snoring: Secondary | ICD-10-CM | POA: Diagnosis not present

## 2019-09-18 DIAGNOSIS — G4733 Obstructive sleep apnea (adult) (pediatric): Secondary | ICD-10-CM | POA: Insufficient documentation

## 2019-09-18 MED ORDER — PREDNISONE 20 MG PO TABS: 40.0000 mg | ORAL_TABLET | Freq: Every day | ORAL | 0 refills | Status: AC

## 2019-09-18 MED ORDER — FLUTICASONE-SALMETEROL 100-50 MCG/DOSE IN AEPB: 1.0000 | INHALATION_SPRAY | Freq: Two times a day (BID) | RESPIRATORY_TRACT | 3 refills | Status: DC

## 2019-09-18 MED ORDER — ALBUTEROL SULFATE HFA 108 (90 BASE) MCG/ACT IN AERS: 2.0000 | INHALATION_SPRAY | Freq: Four times a day (QID) | RESPIRATORY_TRACT | 5 refills | Status: AC | PRN

## 2019-09-18 NOTE — Patient Instructions (Signed)
Asthma, Adult  Asthma is a long-term (chronic) condition in which the airways get tight and narrow. The airways are the breathing passages that lead from the nose and mouth down into the lungs. A person with asthma will have times when symptoms get worse. These are called asthma attacks. They can cause coughing, whistling sounds when you breathe (wheezing), shortness of breath, and chest pain. They can make it hard to breathe. There is no cure for asthma, but medicines and lifestyle changes can help control it. There are many things that can bring on an asthma attack or make asthma symptoms worse (triggers). Common triggers include:  Mold.  Dust.  Cigarette smoke.  Cockroaches.  Things that can cause allergy symptoms (allergens). These include animal skin flakes (dander) and pollen from trees or grass.  Things that pollute the air. These may include household cleaners, wood smoke, smog, or chemical odors.  Cold air, weather changes, and wind.  Crying or laughing hard.  Stress.  Certain medicines or drugs.  Certain foods such as dried fruit, potato chips, and grape juice.  Infections, such as a cold or the flu.  Certain medical conditions or diseases.  Exercise or tiring activities. Asthma may be treated with medicines and by staying away from the things that cause asthma attacks. Types of medicines may include:  Controller medicines. These help prevent asthma symptoms. They are usually taken every day.  Fast-acting reliever or rescue medicines. These quickly relieve asthma symptoms. They are used as needed and provide short-term relief.  Allergy medicines if your attacks are brought on by allergens.  Medicines to help control the body's defense (immune) system. Follow these instructions at home: Avoiding triggers in your home  Change your heating and air conditioning filter often.  Limit your use of fireplaces and wood stoves.  Get rid of pests (such as roaches and  mice) and their droppings.  Throw away plants if you see mold on them.  Clean your floors. Dust regularly. Use cleaning products that do not smell.  Have someone vacuum when you are not home. Use a vacuum cleaner with a HEPA filter if possible.  Replace carpet with wood, tile, or vinyl flooring. Carpet can trap animal skin flakes and dust.  Use allergy-proof pillows, mattress covers, and box spring covers.  Wash bed sheets and blankets every week in hot water. Dry them in a dryer.  Keep your bedroom free of any triggers.  Avoid pets and keep windows closed when things that cause allergy symptoms are in the air.  Use blankets that are made of polyester or cotton.  Clean bathrooms and kitchens with bleach. If possible, have someone repaint the walls in these rooms with mold-resistant paint. Keep out of the rooms that are being cleaned and painted.  Wash your hands often with soap and water. If soap and water are not available, use hand sanitizer.  Do not allow anyone to smoke in your home. General instructions  Take over-the-counter and prescription medicines only as told by your doctor. ? Talk with your doctor if you have questions about how or when to take your medicines. ? Make note if you need to use your medicines more often than usual.  Do not use any products that contain nicotine or tobacco, such as cigarettes and e-cigarettes. If you need help quitting, ask your doctor.  Stay away from secondhand smoke.  Avoid doing things outdoors when allergen counts are high and when air quality is low.  Wear a ski mask   when doing outdoor activities in the winter. The mask should cover your nose and mouth. Exercise indoors on cold days if you can.  Warm up before you exercise. Take time to cool down after exercise.  Use a peak flow meter as told by your doctor. A peak flow meter is a tool that measures how well the lungs are working.  Keep track of the peak flow meter's readings.  Write them down.  Follow your asthma action plan. This is a written plan for taking care of your asthma and treating your attacks.  Make sure you get all the shots (vaccines) that your doctor recommends. Ask your doctor about a flu shot and a pneumonia shot.  Keep all follow-up visits as told by your doctor. This is important. Contact a doctor if:  You have wheezing, shortness of breath, or a cough even while taking medicine to prevent attacks.  The mucus you cough up (sputum) is thicker than usual.  The mucus you cough up changes from clear or white to yellow, green, gray, or bloody.  You have problems from the medicine you are taking, such as: ? A rash. ? Itching. ? Swelling. ? Trouble breathing.  You need reliever medicines more than 2-3 times a week.  Your peak flow reading is still at 50-79% of your personal best after following the action plan for 1 hour.  You have a fever. Get help right away if:  You seem to be worse and are not responding to medicine during an asthma attack.  You are short of breath even at rest.  You get short of breath when doing very little activity.  You have trouble eating, drinking, or talking.  You have chest pain or tightness.  You have a fast heartbeat.  Your lips or fingernails start to turn blue.  You are light-headed or dizzy, or you faint.  Your peak flow is less than 50% of your personal best.  You feel too tired to breathe normally. Summary  Asthma is a long-term (chronic) condition in which the airways get tight and narrow. An asthma attack can make it hard to breathe.  Asthma cannot be cured, but medicines and lifestyle changes can help control it.  Make sure you understand how to avoid triggers and how and when to use your medicines. This information is not intended to replace advice given to you by your health care provider. Make sure you discuss any questions you have with your health care provider. Document Revised:  07/24/2018 Document Reviewed: 06/25/2016 Elsevier Patient Education  2020 [employer]  

## 2019-09-18 NOTE — Assessment & Plan Note (Signed)
Family reports snoring with apneic periods noted.  Discussed at length with her and she agrees with sleep study.  Educated her on benefits of sleep study and use of CPAP for overall health.  Referral placed.

## 2019-09-18 NOTE — Progress Notes (Signed)
BP 118/78 (BP Location: Left Arm)   Pulse 97   Temp 98.5 F (36.9 C) (Oral)   SpO2 98%    Subjective:    [name], female    DOB: [birth year], 44 y.o.   MRN: [id number]  HPI: Sydney White is a 44 y.o. female  Chief Complaint  Patient presents with  . Asthma    pt states she has been coughing due to asthma and allergies    ASTHMA Been worsening over past two weeks.  Took Benadryl this morning, which helped some, but made her sleepy.  She is on Singulair and Allegra daily + Flonase.  Does not have Albuterol inhaler at home.  Has nebulizer, only started using this again on Monday and used twice this week -- Albuterol nebs.   Asthma status: exacerbated Satisfied with current treatment?: no -- would like inhaler to carry with her Albuterol/rescue inhaler frequency: recently started using nebs again -- 2 times this week Dyspnea frequency: 2-4 times a day Wheezing frequency: 2-4 times a day Cough frequency: 2-4 times a day Nocturnal symptom frequency: congested at night only -- her husband reports some apnea and snoring -- has not had sleep study Limitation of activity: no Current upper respiratory symptoms: no Triggers: seasonal -- pollen Home peak flows: none Last Spirometry: unknown Failed/intolerant to following asthma meds: none Asthma meds in past: Symbicort -- she felt this did not work well Aerochamber/spacer use: no Visits to ER or Urgent Care in past year: no Pneumovax: Not up to Date Influenza: Up to Date  Relevant past medical, surgical, family and social history reviewed and updated as indicated. Interim medical history since our last visit reviewed. Allergies and medications reviewed and updated.  Review of Systems  Constitutional: Negative for activity change, appetite change, diaphoresis, fatigue and fever.  Respiratory: Positive for cough, shortness of breath and wheezing. Negative for chest tightness.   Cardiovascular: Negative for  chest pain, palpitations and leg swelling.  Gastrointestinal: Negative.   Neurological: Negative.   Psychiatric/Behavioral: Negative.     Per HPI unless specifically indicated above     Objective:    BP 118/78 (BP Location: Left Arm)   Pulse 97   Temp 98.5 F (36.9 C) (Oral)   SpO2 98%   Wt Readings from Last 3 Encounters:  04/21/19 251 lb (113.9 kg)  04/16/19 248 lb (112.5 kg)  04/14/19 248 lb (112.5 kg)    Physical Exam Vitals and nursing note reviewed.  Constitutional:      General: She is awake. She is not in acute distress.    Appearance: She is well-developed and well-groomed. She is obese. She is not ill-appearing.  HENT:     Head: Normocephalic.     Right Ear: Hearing normal.     Left Ear: Hearing normal.  Eyes:     General: Lids are normal.        Right eye: No discharge.        Left eye: No discharge.     Conjunctiva/sclera: Conjunctivae normal.     Pupils: Pupils are equal, round, and reactive to light.  Neck:     Thyroid: No thyromegaly.     Vascular: No carotid bruit.  Cardiovascular:     Rate and Rhythm: Normal rate and regular rhythm.     Heart sounds: Normal heart sounds. No murmur. No gallop.   Pulmonary:     Effort: Pulmonary effort is normal. No accessory muscle usage or respiratory distress.  Breath sounds: Wheezing present.     Comments: Scattered fine expiratory wheezes noted intermittently throughout.  Clear with cough.   Abdominal:     General: Bowel sounds are normal.     Palpations: Abdomen is soft. There is no hepatomegaly or splenomegaly.  Musculoskeletal:     Cervical back: Normal range of motion and neck supple.     Right lower leg: No edema.     Left lower leg: No edema.  Skin:    General: Skin is warm and dry.  Neurological:     Mental Status: She is alert and oriented to person, place, and time.  Psychiatric:        Attention and Perception: Attention normal.        Mood and Affect: Mood normal.        Speech: Speech  normal.        Behavior: Behavior normal. Behavior is cooperative.        Thought Content: Thought content normal.    Results for orders placed or performed in visit on 04/16/19  Urine Culture-GYN   Specimen: Urine, Clean Catch   UR  Result Value Ref Range   Urine Culture, Routine Final report    Organism ID, Bacteria Comment   POCT Urinalysis Dipstick  Result Value Ref Range   Color, UA yellow    Clarity, UA clear    Glucose, UA Negative Negative   Bilirubin, UA neg    Ketones, UA neg    Spec Grav, UA 1.010 1.010 - 1.025   Blood, UA neg    pH, UA 6.0 5.0 - 8.0   Protein, UA Negative Negative   Urobilinogen, UA     Nitrite, UA neg    Leukocytes, UA Negative Negative   Appearance     Odor        Assessment & Plan:   Problem List Items Addressed This Visit      Respiratory   Asthma - Primary    Chronic, ongoing with current worsening symptoms.  Would benefit from maintenance inhaler.  Will try Advair, script sent and educated on swish and rinse after use.  Script also sent for Albuterol inhaler to carry with her, discussed this is safety for her and to use only as needed for wheezing or SOB.  Will provide short burst of Prednisone at this time to help with current symptoms, script sent in.  Will have return in May for follow-up, would benefit from spirometry once Covid restrictions lifted, will plan on this at upcoming visit.        Relevant Medications   Fluticasone-Salmeterol (ADVAIR) 100-50 MCG/DOSE AEPB   albuterol (VENTOLIN HFA) 108 (90 Base) MCG/ACT inhaler   predniSONE (DELTASONE) 20 MG tablet     Other   Snoring    Family reports snoring with apneic periods noted.  Discussed at length with her and she agrees with sleep study.  Educated her on benefits of sleep study and use of CPAP for overall health.  Referral placed.      Relevant Orders   Ambulatory referral to Sleep Studies       Follow up plan: Return in about 6 weeks (around 10/30/2019) for Asthma  --  as scheduled in May.

## 2019-09-18 NOTE — Assessment & Plan Note (Signed)
Chronic, ongoing with current worsening symptoms.  Would benefit from maintenance inhaler.  Will try Advair, script sent and educated on swish and rinse after use.  Script also sent for Albuterol inhaler to carry with her, discussed this is safety for her and to use only as needed for wheezing or SOB.  Will provide short burst of Prednisone at this time to help with current symptoms, script sent in.  Will have return in May for follow-up, would benefit from spirometry once Covid restrictions lifted, will plan on this at upcoming visit.

## 2019-09-23 ENCOUNTER — Encounter: Payer: Self-pay | Admitting: Nurse Practitioner

## 2019-09-23 ENCOUNTER — Other Ambulatory Visit: Payer: Self-pay | Admitting: Nurse Practitioner

## 2019-09-23 MED ORDER — METHOCARBAMOL 500 MG PO TABS: 500.0000 mg | ORAL_TABLET | Freq: Three times a day (TID) | ORAL | 0 refills | Status: DC | PRN

## 2019-10-09 ENCOUNTER — Ambulatory Visit (INDEPENDENT_AMBULATORY_CARE_PROVIDER_SITE_OTHER): Payer: BC Managed Care – PPO | Admitting: Nurse Practitioner

## 2019-10-09 ENCOUNTER — Other Ambulatory Visit: Payer: Self-pay

## 2019-10-09 ENCOUNTER — Encounter: Payer: Self-pay | Admitting: Nurse Practitioner

## 2019-10-09 DIAGNOSIS — J4520 Mild intermittent asthma, uncomplicated: Secondary | ICD-10-CM

## 2019-10-09 DIAGNOSIS — R0683 Snoring: Secondary | ICD-10-CM

## 2019-10-09 DIAGNOSIS — R030 Elevated blood-pressure reading, without diagnosis of hypertension: Secondary | ICD-10-CM

## 2019-10-09 DIAGNOSIS — J301 Allergic rhinitis due to pollen: Secondary | ICD-10-CM

## 2019-10-09 DIAGNOSIS — F3281 Premenstrual dysphoric disorder: Secondary | ICD-10-CM

## 2019-10-09 NOTE — Patient Instructions (Signed)
http://www.aaaai.org/conditions-and-treatments/asthma">  Asthma, Adult  Asthma is a long-term (chronic) condition that causes recurrent episodes in which the airways become tight and narrow. The airways are the passages that lead from the nose and mouth down into the lungs. Asthma episodes, also called asthma attacks, can cause coughing, wheezing, shortness of breath, and chest pain. The airways can also fill with mucus. During an attack, it can be difficult to breathe. Asthma attacks can range from minor to life threatening. Asthma cannot be cured, but medicines and lifestyle changes can help control it and treat acute attacks. What are the causes? This condition is believed to be caused by inherited (genetic) and environmental factors, but its exact cause is not known. There are many things that can bring on an asthma attack or make asthma symptoms worse (triggers). Asthma triggers are different for each person. Common triggers include:  Mold.  Dust.  Cigarette smoke.  Cockroaches.  Things that can cause allergy symptoms (allergens), such as animal dander or pollen from trees or grass.  Air pollutants such as household cleaners, wood smoke, smog, or chemical odors.  Cold air, weather changes, and winds (which increase molds and pollen in the air).  Strong emotional expressions such as crying or laughing hard.  Stress.  Certain medicines (such as aspirin) or types of medicines (such as beta-blockers).  Sulfites in foods and drinks. Foods and drinks that may contain sulfites include dried fruit, potato chips, and sparkling grape juice.  Infections or inflammatory conditions such as the flu, a cold, or inflammation of the nasal membranes (rhinitis).  Gastroesophageal reflux disease (GERD).  Exercise or strenuous activity. What are the signs or symptoms? Symptoms of this condition may occur right after asthma is triggered or many hours later. Symptoms include:  Wheezing. This can  sound like whistling when you breathe.  Excessive nighttime or early morning coughing.  Frequent or severe coughing with a common cold.  Chest tightness.  Shortness of breath.  Tiredness (fatigue) with minimal activity. How is this diagnosed? This condition is diagnosed based on:  Your medical history.  A physical exam.  Tests, which may include: ? Lung function studies and pulmonary studies (spirometry). These tests can evaluate the flow of air in your lungs. ? Allergy tests. ? Imaging tests, such as X-rays. How is this treated? There is no cure for this condition, but treatment can help control your symptoms. Treatment for asthma usually involves:  Identifying and avoiding your asthma triggers.  Using medicines to control your symptoms. Generally, two types of medicines are used to treat asthma: ? Controller medicines. These help prevent asthma symptoms from occurring. They are usually taken every day. ? Fast-acting reliever or rescue medicines. These quickly relieve asthma symptoms by widening the narrow and tight airways. They are used as needed and provide short-term relief.  Using supplemental oxygen. This may be needed during a severe episode.  Using other medicines, such as: ? Allergy medicines, such as antihistamines, if your asthma attacks are triggered by allergens. ? Immune medicines (immunomodulators). These are medicines that help control the immune system.  Creating an asthma action plan. An asthma action plan is a written plan for managing and treating your asthma attacks. This plan includes: ? A list of your asthma triggers and how to avoid them. ? Information about when medicines should be taken and when their dosage should be changed. ? Instructions about using a device called a peak flow meter. A peak flow meter measures how well the lungs are working   and the severity of your asthma. It helps you monitor your condition. Follow these instructions at  home: Controlling your home environment Control your home environment in the following ways to help avoid triggers and prevent asthma attacks:  Change your heating and air conditioning filter regularly.  Limit your use of fireplaces and wood stoves.  Get rid of pests (such as roaches and mice) and their droppings.  Throw away plants if you see mold on them.  Clean floors and dust surfaces regularly. Use unscented cleaning products.  Try to have someone else vacuum for you regularly. Stay out of rooms while they are being vacuumed and for a short while afterward. If you vacuum, use a dust mask from a hardware store, a double-layered or microfilter vacuum cleaner bag, or a vacuum cleaner with a HEPA filter.  Replace carpet with wood, tile, or vinyl flooring. Carpet can trap dander and dust.  Use allergy-proof pillows, mattress covers, and box spring covers.  Keep your bedroom a trigger-free room.  Avoid pets and keep windows closed when allergens are in the air.  Wash beddings every week in hot water and dry them in a dryer.  Use blankets that are made of polyester or cotton.  Clean bathrooms and kitchens with bleach. If possible, have someone repaint the walls in these rooms with mold-resistant paint. Stay out of the rooms that are being cleaned and painted.  Wash your hands often with soap and water. If soap and water are not available, use hand sanitizer.  Do not allow anyone to smoke in your home. General instructions  Take over-the-counter and prescription medicines only as told by your health care provider. ? Speak with your health care provider if you have questions about how or when to take the medicines. ? Make note if you are requiring more frequent dosages.  Do not use any products that contain nicotine or tobacco, such as cigarettes and e-cigarettes. If you need help quitting, ask your health care provider. Also, avoid being exposed to secondhand smoke.  Use a peak  flow meter as told by your health care provider. Record and keep track of the readings.  Understand and use the asthma action plan to help minimize, or stop an asthma attack, without needing to seek medical care.  Make sure you stay up to date on your yearly vaccinations as told by your health care provider. This may include vaccines for the flu and pneumonia.  Avoid outdoor activities when allergen counts are high and when air quality is low.  Wear a ski mask that covers your nose and mouth during outdoor winter activities. Exercise indoors on cold days if you can.  Warm up before exercising, and take time for a cool-down period after exercise.  Keep all follow-up visits as told by your health care provider. This is important. Where to find more information  For information about asthma, turn to the Centers for Disease Control and Prevention at www.cdc.gov/asthma/faqs.htm  For air quality information, turn to [employer]Now at https://airnow.gov/ Contact a health care provider if:  You have wheezing, shortness of breath, or a cough even while you are taking medicine to prevent attacks.  The mucus you cough up (sputum) is thicker than usual.  Your sputum changes from clear or white to yellow, green, gray, or bloody.  Your medicines are causing side effects, such as a rash, itching, swelling, or trouble breathing.  You need to use a reliever medicine more than 2-3 times a week.  Your peak   flow reading is still at 50-79% of your personal best after following your action plan for 1 hour.  You have a fever. Get help right away if:  You are getting worse and do not respond to treatment during an asthma attack.  You are short of breath when at rest or when doing very little physical activity.  You have difficulty eating, drinking, or talking.  You have chest pain or tightness.  You develop a fast heartbeat or palpitations.  You have a bluish color to your lips or fingernails.  You  are light-headed or dizzy, or you faint.  Your peak flow reading is less than 50% of your personal best.  You feel too tired to breathe normally. Summary  Asthma is a long-term (chronic) condition that causes recurrent episodes in which the airways become tight and narrow. These episodes can cause coughing, wheezing, shortness of breath, and chest pain.  Asthma cannot be cured, but medicines and lifestyle changes can help control it and treat acute attacks.  Make sure you understand how to avoid triggers and how and when to use your medicines.  Asthma attacks can range from minor to life threatening. Get help right away if you have an asthma attack and do not respond to treatment with your usual rescue medicines. This information is not intended to replace advice given to you by your health care provider. Make sure you discuss any questions you have with your health care provider. Document Revised: 07/24/2018 Document Reviewed: 06/25/2016 [employer] Patient Education  2020 [employer]  

## 2019-10-09 NOTE — Assessment & Plan Note (Signed)
Chronic, ongoing.  Poor control with Singulair, Flonase, and Allegra.  Referral to allergist for further work-up + possible allergy shots, which would be beneficial.

## 2019-10-09 NOTE — Assessment & Plan Note (Signed)
Recommend she call to schedule sleep study.

## 2019-10-09 NOTE — Assessment & Plan Note (Signed)
Chronic, ongoing.  Continue Prozac daily and Buspar as needed, adjust as needed.  Denies SI/HI.  Does not wish to change doses at this time.  Return in 6 months.

## 2019-10-09 NOTE — Assessment & Plan Note (Signed)
BMI 46.27.  Recommended eating smaller high protein, low fat meals more frequently and exercising 30 mins a day 5 times a week with a goal of 10-15lb weight loss in the next 3 months. Patient voiced their understanding and motivation to adhere to these recommendations.

## 2019-10-09 NOTE — Progress Notes (Signed)
BP 128/70 (BP Location: Left Arm)   Pulse 90   Temp 97.8 F (36.6 C) (Oral)   Wt 261 lb 3.2 oz (118.5 kg)   SpO2 97%   BMI 46.27 kg/m    Subjective:    [name], female    DOB: [birth year], 44 y.o.   MRN: [id number]  HPI: Sydney White is a 44 y.o. female  Chief Complaint  Patient presents with  . Depression  . Hypertension   ASTHMA Last visit started on Advair due to poor control + Albuterol inhaler to carry with her.  Was treated for exacerbation in April.  She is on Singulair and Allegra daily + Flonase.  Has nebulizer, only started using this again recently on occasion -- Albuterol nebs.  She feels once gets through pollen, she will be okay.  She has seen allergist in past, was offered shots, but did not want to do them every week.   Asthma status: stable Satisfied with current treatment?: yes Dyspnea frequency: daily Wheezing frequency: daily Cough frequency: daily Nocturnal symptom frequency: congested at night only -- her husband reports some apnea and snoring -- referred for sleep study last visit, she has not called to schedule Limitation of activity: no Current upper respiratory symptoms: no Triggers: seasonal -- pollen Home peak flows: none Last Spirometry: unknown Failed/intolerant to following asthma meds: none Asthma meds in past: Symbicort -- she felt this did not work well Aerochamber/spacer use: no Visits to ER or Urgent Care in past year: no Pneumovax: Not up to Date Influenza: Up to Date  HYPERTENSION No current medications.  Referred for sleep study last visit, but has not attended as of yet.  Needs to call them back.   Hypertension status: stable  Satisfied with current treatment? yes Duration of hypertension: chronic BP monitoring frequency:  A few times a week BP range: 118/80's range at home BP medication side effects:  no Medication compliance: good compliance Aspirin: no Recurrent headaches: no Visual changes:  no Palpitations: no Dyspnea: no Chest pain: no Lower extremity edema: no Dizzy/lightheaded: no   DEPRESSION Continues on Prozac 20 MG + Buspar PRN, a bit exacerbated at this time due to end of school year and collecting chrome books.  Plus anxious about a recent interview she had in [place].  Her husband is still unemployed and still does not have stimulus.   Mood status: stable Satisfied with current treatment?: yes Symptom severity: moderate  Duration of current treatment : chronic Side effects: no Medication compliance: good compliance Psychotherapy/counseling: none Depressed mood: yes Anxious mood: yes Anhedonia: no Significant weight loss or gain: no Insomnia: none Fatigue: no Feelings of worthlessness or guilt: no Impaired concentration/indecisiveness: no Suicidal ideations: no Hopelessness: no Crying spells: no Depression screen [facility] 2/9 10/09/2019 04/10/2019 02/06/2019 04/30/2018 07/22/2017  Decreased Interest 1 1 3  0 0  Down, Depressed, Hopeless 1 1 - 1 0  PHQ - 2 Score 2 2 3 1  0  Altered sleeping 2 1 1  0 1  Tired, decreased energy 3 1 3 1 1   Change in appetite 3 1 3 2 1   Feeling bad or failure about yourself  1 0 1 1 0  Trouble concentrating 1 0 1 0 1  Moving slowly or fidgety/restless 0 0 0 0 0  Suicidal thoughts 1 0 0 1 -  PHQ-9 Score 13 5 12 6 4   Difficult doing work/chores Somewhat difficult Not difficult at all Somewhat difficult Not difficult at all -  Relevant past medical, surgical, family and social history reviewed and updated as indicated. Interim medical history since our last visit reviewed. Allergies and medications reviewed and updated.  Review of Systems  Constitutional: Negative for activity change, appetite change, diaphoresis, fatigue and fever.  Respiratory: Positive for cough (occasional), shortness of breath (occasional) and wheezing (occasional). Negative for chest tightness.   Cardiovascular: Negative for chest pain, palpitations and  leg swelling.  Gastrointestinal: Negative.   Neurological: Negative.   Psychiatric/Behavioral: Positive for decreased concentration and sleep disturbance. Negative for self-injury and suicidal ideas. The patient is nervous/anxious.    Per HPI unless specifically indicated above     Objective:    BP 128/70 (BP Location: Left Arm)   Pulse 90   Temp 97.8 F (36.6 C) (Oral)   Wt 261 lb 3.2 oz (118.5 kg)   SpO2 97%   BMI 46.27 kg/m   Wt Readings from Last 3 Encounters:  10/09/19 261 lb 3.2 oz (118.5 kg)  04/21/19 251 lb (113.9 kg)  04/16/19 248 lb (112.5 kg)    Physical Exam Vitals and nursing note reviewed.  Constitutional:      General: She is awake. She is not in acute distress.    Appearance: She is well-developed and well-groomed. She is morbidly obese. She is not ill-appearing.  HENT:     Head: Normocephalic.     Right Ear: Hearing normal.     Left Ear: Hearing normal.  Eyes:     General: Lids are normal.        Right eye: No discharge.        Left eye: No discharge.     Conjunctiva/sclera: Conjunctivae normal.     Pupils: Pupils are equal, round, and reactive to light.  Neck:     Thyroid: No thyromegaly.     Vascular: No carotid bruit.  Cardiovascular:     Rate and Rhythm: Normal rate and regular rhythm.     Heart sounds: Normal heart sounds. No murmur. No gallop.   Pulmonary:     Effort: Pulmonary effort is normal. No accessory muscle usage or respiratory distress.     Breath sounds: Normal breath sounds. No wheezing.     Comments: No cough present during exam period. Abdominal:     General: Bowel sounds are normal.     Palpations: Abdomen is soft. There is no hepatomegaly or splenomegaly.  Musculoskeletal:     Cervical back: Normal range of motion and neck supple.     Right lower leg: No edema.     Left lower leg: No edema.  Skin:    General: Skin is warm and dry.  Neurological:     Mental Status: She is alert and oriented to person, place, and time.   Psychiatric:        Attention and Perception: Attention normal.        Mood and Affect: Mood normal.        Speech: Speech normal.        Behavior: Behavior normal. Behavior is cooperative.        Thought Content: Thought content normal.    Results for orders placed or performed in visit on 04/16/19  Urine Culture-GYN   Specimen: Urine, Clean Catch   UR  Result Value Ref Range   Urine Culture, Routine Final report    Organism ID, Bacteria Comment   POCT Urinalysis Dipstick  Result Value Ref Range   Color, UA yellow    Clarity, UA clear  Glucose, UA Negative Negative   Bilirubin, UA neg    Ketones, UA neg    Spec Grav, UA 1.010 1.010 - 1.025   Blood, UA neg    pH, UA 6.0 5.0 - 8.0   Protein, UA Negative Negative   Urobilinogen, UA     Nitrite, UA neg    Leukocytes, UA Negative Negative   Appearance     Odor        Assessment & Plan:   Problem List Items Addressed This Visit      Respiratory   Asthma    Chronic, ongoing.  Continue Advair daily and Albuterol as needed + Singulair/Flonase/Allegra.  Referral to allergist placed, discussed at length would benefit from further work-up and allergy shots to help with symptoms.  Recommend modest weight loss.  Return for any exacerbation symptoms and in 6 months for physical.      Allergic rhinitis    Chronic, ongoing.  Poor control with Singulair, Flonase, and Allegra.  Referral to allergist for further work-up + possible allergy shots, which would be beneficial.          Other   PMDD (premenstrual dysphoric disorder)    Chronic, ongoing.  Continue Prozac daily and Buspar as needed, adjust as needed.  Denies SI/HI.  Does not wish to change doses at this time.  Return in 6 months.      Elevated BP without diagnosis of hypertension    Ongoing, initial elevated but repeat improved and home BP at goal.  Recommend she continue to check BP at home at least 3 mornings a week + continue focus on DASH diet and regular exercise  with goal of modest weight loss.  Return in 6 months.  If consistent elevations noted consider Lisinopril or Losartan.        Morbid obesity (HCC)    BMI 46.27.  Recommended eating smaller high protein, low fat meals more frequently and exercising 30 mins a day 5 times a week with a goal of 10-15lb weight loss in the next 3 months. Patient voiced their understanding and motivation to adhere to these recommendations.       Snoring    Recommend she call to schedule sleep study.          Follow up plan: Return in about 6 months (around 04/10/2020) for Annual physical.

## 2019-10-09 NOTE — Assessment & Plan Note (Signed)
Ongoing, initial elevated but repeat improved and home BP at goal.  Recommend she continue to check BP at home at least 3 mornings a week + continue focus on DASH diet and regular exercise with goal of modest weight loss.  Return in 6 months.  If consistent elevations noted consider Lisinopril or Losartan.

## 2019-10-09 NOTE — Assessment & Plan Note (Signed)
Chronic, ongoing.  Continue Advair daily and Albuterol as needed + Singulair/Flonase/Allegra.  Referral to allergist placed, discussed at length would benefit from further work-up and allergy shots to help with symptoms.  Recommend modest weight loss.  Return for any exacerbation symptoms and in 6 months for physical.

## 2020-01-01 ENCOUNTER — Telehealth: Payer: Self-pay | Admitting: Nurse Practitioner

## 2020-03-31 ENCOUNTER — Encounter: Payer: Self-pay | Admitting: Unknown Physician Specialty

## 2020-03-31 ENCOUNTER — Telehealth (INDEPENDENT_AMBULATORY_CARE_PROVIDER_SITE_OTHER): Payer: BC Managed Care – PPO | Admitting: Unknown Physician Specialty

## 2020-03-31 DIAGNOSIS — J4520 Mild intermittent asthma, uncomplicated: Secondary | ICD-10-CM

## 2020-03-31 MED ORDER — BUDESONIDE-FORMOTEROL FUMARATE 160-4.5 MCG/ACT IN AERO: 2.0000 | INHALATION_SPRAY | Freq: Two times a day (BID) | RESPIRATORY_TRACT | 3 refills | Status: DC

## 2020-03-31 NOTE — Assessment & Plan Note (Addendum)
Encouraged to get a PCR test.  Stop Advair and change to Symbicort BID.  Takes Allegra/switch to Zyrtec. Already takes Montelukast.

## 2020-03-31 NOTE — Progress Notes (Signed)
MyChart Video Visit    Virtual Visit via Video Note   This visit type was conducted due to national recommendations for restrictions regarding the COVID-19 Pandemic (e.g. social distancing) in an effort to limit this patient's exposure and mitigate transmission in our community. This patient is at least at moderate risk for complications without adequate follow up. This format is felt to be most appropriate for this patient at this time. Physical exam was limited by quality of the video and audio technology used for the visit.   Patient location: work Provider location: work  I discussed the limitations of evaluation and management by telemedicine and the availability of in person appointments. The patient expressed understanding and agreed to proceed.  Patient: Sydney White   DOB: 07-11-1975   44 y.o. Female  MRN: [id number] Visit Date: 03/31/2020  Today's healthcare provider: Kathrine Haddock, NP   Chief Complaint  Patient presents with  . Asthma    Patient presents in office today with complaints of cough for the past 5 days that she describes as productive, wheezing and shortness of breath on exertion. Patient states that she has been using her prescription inhalers ( Ventolin and Advair) with mild relief.    Subjective    Cough Chronicity: 5 days. Episode onset: Had a Binex test yesterday which was negative. The problem has been gradually worsening. The problem occurs constantly. The cough is productive of sputum. Pertinent negatives include no chest pain, chills, fever, headaches, nasal congestion, rhinorrhea, sore throat, shortness of breath or weight loss. The symptoms are aggravated by lying down. She has tried a beta-agonist inhaler for the symptoms. Her past medical history is significant for asthma.   HPI    Asthma     Additional comments: Patient presents in office today with complaints of cough for the past 5 days that she describes as productive, wheezing and  shortness of breath on exertion. Patient states that she has been using her prescription inhalers ( Ventolin and Advair) with mild relief.        Last edited by Minette Headland, CMA on 03/31/2020 10:58 AM. (History)      Taking Advair and Ventolin prn.    Medications: Outpatient Medications Prior to Visit  Medication Sig  . albuterol (VENTOLIN HFA) 108 (90 Base) MCG/ACT inhaler Inhale 2 puffs into the lungs every 6 (six) hours as needed for wheezing or shortness of breath.  . busPIRone (BUSPAR) 5 MG tablet Take 1 tablet (5 mg total) by mouth 2 (two) times daily as needed (for anxiety).  . fexofenadine (ALLEGRA) 180 MG tablet Take 180 mg by mouth daily.  Marland Kitchen FLUoxetine (PROZAC) 20 MG capsule TAKE 1 CAPSULE BY MOUTH EVERY DAY  . IBUPROFEN PO Take by mouth as needed.  . montelukast (SINGULAIR) 10 MG tablet Take 1 tablet (10 mg total) by mouth daily.  Marland Kitchen omeprazole (PRILOSEC) 20 MG capsule Take 1 capsule (20 mg total) by mouth daily.  . [DISCONTINUED] fluticasone (FLONASE) 50 MCG/ACT nasal spray Place 1 spray into both nostrils daily.  . [DISCONTINUED] Fluticasone-Salmeterol (ADVAIR) 100-50 MCG/DOSE AEPB Inhale 1 puff into the lungs 2 (two) times daily.  . [DISCONTINUED] methocarbamol (ROBAXIN) 500 MG tablet Take 1 tablet (500 mg total) by mouth every 8 (eight) hours as needed for muscle spasms.   No facility-administered medications prior to visit.    Review of Systems  Constitutional: Negative for chills, fever and weight loss.  HENT: Negative for rhinorrhea and sore throat.   Respiratory: Positive  for cough. Negative for shortness of breath.   Cardiovascular: Negative for chest pain.  Neurological: Negative for headaches.      Objective    There were no vitals taken for this visit.   Physical Exam Constitutional:      General: She is not in acute distress.    Appearance: Normal appearance. She is well-developed.  HENT:     Head: Normocephalic and atraumatic.  Eyes:      General: Lids are normal. No scleral icterus.       Right eye: No discharge.        Left eye: No discharge.     Conjunctiva/sclera: Conjunctivae normal.  Cardiovascular:     Rate and Rhythm: Normal rate.  Pulmonary:     Effort: Pulmonary effort is normal.  Abdominal:     Palpations: There is no hepatomegaly or splenomegaly.  Musculoskeletal:        General: Normal range of motion.  Skin:    Coloration: Skin is not pale.     Findings: No rash.  Neurological:     Mental Status: She is alert and oriented to person, place, and time.  Psychiatric:        Behavior: Behavior normal.        Thought Content: Thought content normal.        Judgment: Judgment normal.        Assessment & Plan     Asthma Encouraged to get a PCR test.  Stop Advair and change to Symbicort BID.  Takes Allegra/switch to Zyrtec. Already takes Montelukast.    Return if symptoms worsen or fail to improve.     I discussed the assessment and treatment plan with the patient. The patient was provided an opportunity to ask questions and all were answered. The patient agreed with the plan and demonstrated an understanding of the instructions.   The patient was advised to call back or seek an in-person evaluation if the symptoms worsen or if the condition fails to improve as anticipated.  I provided 20 minutes of non-face-to-face time during this encounter.  Kathrine Haddock, NP [facility] [phone] (phone) [phone] (fax)  [place]

## 2020-04-05 ENCOUNTER — Ambulatory Visit: Payer: BC Managed Care – PPO | Admitting: Nurse Practitioner

## 2020-05-03 ENCOUNTER — Other Ambulatory Visit: Payer: Self-pay | Admitting: Nurse Practitioner

## 2020-06-15 ENCOUNTER — Other Ambulatory Visit: Payer: Self-pay | Admitting: Nurse Practitioner

## 2020-06-15 MED ORDER — FLUTICASONE-SALMETEROL 100-50 MCG/DOSE IN AEPB: 1.0000 | INHALATION_SPRAY | Freq: Two times a day (BID) | RESPIRATORY_TRACT | 4 refills | Status: AC

## 2020-06-30 ENCOUNTER — Other Ambulatory Visit: Payer: Self-pay | Admitting: Nurse Practitioner

## 2020-07-06 ENCOUNTER — Other Ambulatory Visit: Payer: Self-pay | Admitting: Nurse Practitioner

## 2020-07-06 MED ORDER — FLUOXETINE HCL 20 MG PO CAPS: 20.0000 mg | ORAL_CAPSULE | Freq: Every day | ORAL | 4 refills | Status: DC

## 2020-07-26 ENCOUNTER — Encounter: Payer: Self-pay | Admitting: Nurse Practitioner

## 2020-07-26 ENCOUNTER — Other Ambulatory Visit: Payer: Self-pay

## 2020-07-26 ENCOUNTER — Ambulatory Visit: Payer: BC Managed Care – PPO | Admitting: Nurse Practitioner

## 2020-07-26 DIAGNOSIS — G4733 Obstructive sleep apnea (adult) (pediatric): Secondary | ICD-10-CM

## 2020-07-26 DIAGNOSIS — R030 Elevated blood-pressure reading, without diagnosis of hypertension: Secondary | ICD-10-CM

## 2020-07-26 DIAGNOSIS — J4520 Mild intermittent asthma, uncomplicated: Secondary | ICD-10-CM | POA: Diagnosis not present

## 2020-07-26 DIAGNOSIS — R0781 Pleurodynia: Secondary | ICD-10-CM

## 2020-07-26 MED ORDER — CYCLOBENZAPRINE HCL 10 MG PO TABS: 10.0000 mg | ORAL_TABLET | Freq: Every day | ORAL | 0 refills | Status: DC

## 2020-07-26 MED ORDER — PREDNISONE 20 MG PO TABS: 40.0000 mg | ORAL_TABLET | Freq: Every day | ORAL | 0 refills | Status: AC

## 2020-07-26 NOTE — Assessment & Plan Note (Signed)
BMI 46.91 with asthma.  Recommended eating smaller high protein, low fat meals more frequently and exercising 30 mins a day 5 times a week with a goal of 10-15lb weight loss in the next 3 months. Patient voiced their understanding and motivation to adhere to these recommendations.

## 2020-07-26 NOTE — Assessment & Plan Note (Signed)
Chronic, ongoing.  Continue Wixela daily and Albuterol as needed + Singulair/Flonase/Allegra.  Recommend modest weight loss.  Return for any exacerbation symptoms and in 6 months for physical.

## 2020-07-26 NOTE — Patient Instructions (Signed)

## 2020-07-26 NOTE — Progress Notes (Signed)
BP 136/86   Pulse 82   Temp 99.1 F (37.3 C) (Oral)   Wt 264 lb 12.8 oz (120.1 kg)   SpO2 98%   BMI 46.91 kg/m    Subjective:    [name], female    DOB: Mar 29, 1976, 45 y.o.   MRN: [id number]  HPI: Sydney White is a 45 y.o. female  Chief Complaint  Patient presents with  . Chest Pain    Pt states her left side rib pain has re-occurred and she states she noticed this last episode about a month ago   LEFT RIB PAIN Last asthma exacerbation was in October 2021, cough improved one month ago.  Reports left rib area pain that started about 2 weeks ago, had to sit in a "horrible chair" at her workplace.  Taking Ibuprofen and Tylenol which is not helping.  Pain at worst is 6-7/10, currently 4/10.  Lays down to relax rib, rolls over.  Notices it more from sitting long periods of time.  Had similar episode one year ago, which resolved over time.  Has used Biofreeze, which does help a little + using heat.   Recurrent headaches: no Visual changes: no Palpitations: no Dyspnea: no Chest pain: no Lower extremity edema: no Dizzy/lightheaded: no   ASTHMA Continues Wixela and Albuterol.  Has CPAP and is using this regularly since November.  She started NOOM diet last week and has lost 6 pounds.   Asthma status: stable Satisfied with current treatment?: yes Albuterol/rescue inhaler frequency: not used in long time Dyspnea frequency: none Wheezing frequency: none  Cough frequency: improved with Wixela Nocturnal symptom frequency: none Limitation of activity: no Current upper respiratory symptoms: no Triggers: seasonal Home peak flows: none Last Spirometry: unknown Failed/intolerant to following asthma meds: none Asthma meds in past: unknown Aerochamber/spacer use: no Visits to ER or Urgent Care in past year: no Pneumovax: Not up to Date Influenza: refused  Relevant past medical, surgical, family and social history reviewed and updated as indicated. Interim  medical history since our last visit reviewed. Allergies and medications reviewed and updated.  Review of Systems  Constitutional: Negative for activity change, appetite change, diaphoresis, fatigue and fever.  Respiratory: Negative for cough, chest tightness and shortness of breath.   Cardiovascular: Negative for chest pain, palpitations and leg swelling.  Gastrointestinal: Negative.   Musculoskeletal: Positive for arthralgias.  Neurological: Negative.   Psychiatric/Behavioral: Negative.     Per HPI unless specifically indicated above     Objective:    BP 136/86   Pulse 82   Temp 99.1 F (37.3 C) (Oral)   Wt 264 lb 12.8 oz (120.1 kg)   SpO2 98%   BMI 46.91 kg/m   Wt Readings from Last 3 Encounters:  07/26/20 264 lb 12.8 oz (120.1 kg)  10/09/19 261 lb 3.2 oz (118.5 kg)  04/21/19 251 lb (113.9 kg)    Physical Exam Vitals and nursing note reviewed.  Constitutional:      General: She is awake. She is not in acute distress.    Appearance: She is well-developed and well-groomed. She is obese. She is not ill-appearing.  HENT:     Head: Normocephalic.     Right Ear: Hearing normal.     Left Ear: Hearing normal.  Eyes:     General: Lids are normal.        Right eye: No discharge.        Left eye: No discharge.     Conjunctiva/sclera: Conjunctivae normal.  Pupils: Pupils are equal, round, and reactive to light.  Neck:     Thyroid: No thyromegaly.     Vascular: No carotid bruit.  Cardiovascular:     Rate and Rhythm: Normal rate and regular rhythm.     Heart sounds: Normal heart sounds. No murmur heard. No gallop.   Pulmonary:     Effort: Pulmonary effort is normal. No accessory muscle usage or respiratory distress.     Breath sounds: Normal breath sounds. No decreased breath sounds.       Comments: No rashes noted. Abdominal:     General: Bowel sounds are normal.     Palpations: Abdomen is soft. There is no hepatomegaly or splenomegaly.  Musculoskeletal:      Cervical back: Normal range of motion and neck supple.     Right lower leg: No edema.     Left lower leg: No edema.  Skin:    General: Skin is warm and dry.  Neurological:     Mental Status: She is alert and oriented to person, place, and time.  Psychiatric:        Attention and Perception: Attention normal.        Mood and Affect: Mood normal.        Speech: Speech normal.        Behavior: Behavior normal. Behavior is cooperative.        Thought Content: Thought content normal.     Results for orders placed or performed in visit on 04/16/19  Urine Culture-GYN   Specimen: Urine, Clean Catch   UR  Result Value Ref Range   Urine Culture, Routine Final report    Organism ID, Bacteria Comment   POCT Urinalysis Dipstick  Result Value Ref Range   Color, UA yellow    Clarity, UA clear    Glucose, UA Negative Negative   Bilirubin, UA neg    Ketones, UA neg    Spec Grav, UA 1.010 1.010 - 1.025   Blood, UA neg    pH, UA 6.0 5.0 - 8.0   Protein, UA Negative Negative   Urobilinogen, UA     Nitrite, UA neg    Leukocytes, UA Negative Negative   Appearance     Odor        Assessment & Plan:   Problem List Items Addressed This Visit      Respiratory   Asthma    Chronic, ongoing.  Continue Wixela daily and Albuterol as needed + Singulair/Flonase/Allegra.  Recommend modest weight loss.  Return for any exacerbation symptoms and in 6 months for physical.      Relevant Medications   predniSONE (DELTASONE) 20 MG tablet   OSA (obstructive sleep apnea)    Ongoing, continue to use CPAP nightly.        Other   Elevated BP without diagnosis of hypertension    Ongoing, initial elevated but repeat improved.  Recommend she check BP at home at least 3 mornings a week + continue focus on DASH diet and regular exercise with goal of modest weight loss.  Return in 6 months.  If consistent elevations noted consider Lisinopril or Losartan.        Morbid obesity ([place]) - Primary    BMI 46.91  with asthma.  Recommended eating smaller high protein, low fat meals more frequently and exercising 30 mins a day 5 times a week with a goal of 10-15lb weight loss in the next 3 months. Patient voiced their understanding and motivation to adhere  to these recommendations.       Rib pain on left side    Suspect musculoskeletal and related to recent exacerbation with increased coughing, which has now improved.  Reports at this time rib pain is slowly improving.  Will send in Prednisone and Flexeril, instructed on how to use.  Recommend continue Biofreeze and heat at home.  Avoid Ibuprofen due to occasional elevation in BP.  Return for worsening or ongoing symptoms.          Follow up plan: Return in about 6 months (around 01/23/2021) for Annual physical.

## 2020-07-26 NOTE — Assessment & Plan Note (Signed)
Suspect musculoskeletal and related to recent exacerbation with increased coughing, which has now improved.  Reports at this time rib pain is slowly improving.  Will send in Prednisone and Flexeril, instructed on how to use.  Recommend continue Biofreeze and heat at home.  Avoid Ibuprofen due to occasional elevation in BP.  Return for worsening or ongoing symptoms.

## 2020-07-26 NOTE — Assessment & Plan Note (Signed)
Ongoing, initial elevated but repeat improved.  Recommend she check BP at home at least 3 mornings a week + continue focus on DASH diet and regular exercise with goal of modest weight loss.  Return in 6 months.  If consistent elevations noted consider Lisinopril or Losartan.

## 2020-07-26 NOTE — Assessment & Plan Note (Signed)
Ongoing, continue to use CPAP nightly.

## 2021-01-09 ENCOUNTER — Other Ambulatory Visit: Payer: Self-pay | Admitting: Nurse Practitioner

## 2021-01-23 ENCOUNTER — Telehealth: Payer: BC Managed Care – PPO | Admitting: Family

## 2021-01-23 ENCOUNTER — Encounter: Payer: Self-pay | Admitting: Family

## 2021-01-23 DIAGNOSIS — R030 Elevated blood-pressure reading, without diagnosis of hypertension: Secondary | ICD-10-CM | POA: Diagnosis not present

## 2021-01-23 DIAGNOSIS — R0602 Shortness of breath: Secondary | ICD-10-CM | POA: Diagnosis not present

## 2021-01-23 DIAGNOSIS — R079 Chest pain, unspecified: Secondary | ICD-10-CM | POA: Diagnosis not present

## 2021-01-23 DIAGNOSIS — J4520 Mild intermittent asthma, uncomplicated: Secondary | ICD-10-CM

## 2021-01-23 NOTE — Progress Notes (Signed)
Virtual Visit Consent   Viann Balasubramanian, you are scheduled for a virtual visit with a [facility] provider today.     Just as with appointments in the office, your consent must be obtained to participate.  Your consent will be active for this visit and any virtual visit you may have with one of our providers in the next 365 days.     If you have a MyChart account, a copy of this consent can be sent to you electronically.  All virtual visits are billed to your insurance company just like a traditional visit in the office.    As this is a virtual visit, video technology does not allow for your provider to perform a traditional examination.  This may limit your provider's ability to fully assess your condition.  If your provider identifies any concerns that need to be evaluated in person or the need to arrange testing (such as labs, EKG, etc.), we will make arrangements to do so.     Although advances in technology are sophisticated, we cannot ensure that it will always work on either your end or our end.  If the connection with a video visit is poor, the visit may have to be switched to a telephone visit.  With either a video or telephone visit, we are not always able to ensure that we have a secure connection.     I need to obtain your verbal consent now.   Are you willing to proceed with your visit today?    Sydney White has provided verbal consent on 01/23/2021 for a virtual visit (video or telephone).   Evelina Dun, FNP   Date: 01/23/2021 7:01 PM   Virtual Visit via Video Note   I, Evelina Dun, connected with  Sydney White  ([id number], 1975-09-19) on 01/23/21 at  7:00 PM EDT by a video-enabled telemedicine application and verified that I am speaking with the correct person using two identifiers.  Location: Patient: Virtual Visit Location Patient: Home Provider: Virtual Visit Location Provider: Home   I discussed the limitations of evaluation and management by  telemedicine and the availability of in person appointments. The patient expressed understanding and agreed to proceed.    History of Present Illness: Sydney White is a 45 y.o. who identifies as a female who was assigned female at birth, and is being seen today for elevated blood pressure. And left chest pain. She reports she walking outside and started having left chest pain and SOB. She thought it was related to her asthma and used her albuterol. However, this did not help her pain.   She continues to have left chest pain of 2 out 10. She has taken tylenol without relief.   She reports her BP 175/102.   She reports her mother has CAD.   HPI: HPI  Problems:  Patient Active Problem List   Diagnosis Date Noted   Rib pain on left side 07/26/2020   OSA (obstructive sleep apnea) 09/18/2019   Elevated BP without diagnosis of hypertension 04/10/2019   Morbid obesity ([place]) 04/10/2019   Iron deficiency anemia due to chronic blood loss 03/18/2019   Menorrhagia with regular cycle 03/18/2019   Leiomyoma 03/18/2019   Allergic rhinitis 10/02/2017   Asthma 07/22/2017   GERD (gastroesophageal reflux disease) 07/22/2017   PMDD (premenstrual dysphoric disorder) 10/11/2015   PCOS (polycystic ovarian syndrome) 10/11/2015    Allergies:  Allergies  Allergen Reactions   Sulfa Antibiotics Hives   Medications:  Current Outpatient Medications:  albuterol (VENTOLIN HFA) 108 (90 Base) MCG/ACT inhaler, Inhale 2 puffs into the lungs every 6 (six) hours as needed for wheezing or shortness of breath., Disp: 18 g, Rfl: 5   busPIRone (BUSPAR) 5 MG tablet, Take 1 tablet (5 mg total) by mouth 2 (two) times daily as needed (for anxiety)., Disp: 60 tablet, Rfl: 3   cyclobenzaprine (FLEXERIL) 10 MG tablet, Take 1 tablet (10 mg total) by mouth at bedtime. Take only if needed., Disp: 30 tablet, Rfl: 0   fexofenadine (ALLEGRA) 180 MG tablet, Take 180 mg by mouth daily., Disp: , Rfl:    FLUoxetine (PROZAC) 20 MG  capsule, Take 1 capsule (20 mg total) by mouth daily., Disp: 90 capsule, Rfl: 4   Fluticasone-Salmeterol (WIXELA INHUB) 100-50 MCG/DOSE AEPB, Inhale 1 puff into the lungs in the morning and at bedtime., Disp: 60 each, Rfl: 4   IBUPROFEN PO, Take by mouth as needed., Disp: , Rfl:    montelukast (SINGULAIR) 10 MG tablet, TAKE 1 TABLET(10 MG) BY MOUTH DAILY, Disp: 90 tablet, Rfl: 1   omeprazole (PRILOSEC) 20 MG capsule, Take 1 capsule (20 mg total) by mouth daily., Disp: 90 capsule, Rfl: 3  Observations/Objective: Patient is well-developed, well-nourished in no acute distress.  Resting comfortably  at home.  Head is normocephalic, atraumatic.  No labored breathing.  Speech is clear and coherent with logical content.  Patient is alert and oriented at baseline.    Assessment and Plan: 1. Chest pain, unspecified type  2. Elevated blood pressure reading  3. SOB (shortness of breath)  4. Mild intermittent asthma without complication Given left chest pain that started after walking with SOB patient needs to be seen in person for further investigation. She has several risks factors including HTN, morbid obese, family hx.   Follow Up Instructions: I discussed the assessment and treatment plan with the patient. The patient was provided an opportunity to ask questions and all were answered. The patient agreed with the plan and demonstrated an understanding of the instructions.  A copy of instructions were sent to the patient via MyChart.  The patient was advised to call back or seek an in-person evaluation if the symptoms worsen or if the condition fails to improve as anticipated.  Time:  I spent 13 minutes with the patient via telehealth technology discussing the above problems/concerns.    Evelina Dun, FNP

## 2021-02-13 ENCOUNTER — Encounter: Payer: Self-pay | Admitting: Nurse Practitioner

## 2021-02-13 ENCOUNTER — Other Ambulatory Visit: Payer: Self-pay

## 2021-02-13 ENCOUNTER — Ambulatory Visit (INDEPENDENT_AMBULATORY_CARE_PROVIDER_SITE_OTHER): Payer: BC Managed Care – PPO | Admitting: Nurse Practitioner

## 2021-02-13 DIAGNOSIS — Z23 Encounter for immunization: Secondary | ICD-10-CM

## 2021-02-13 DIAGNOSIS — Z0000 Encounter for general adult medical examination without abnormal findings: Secondary | ICD-10-CM | POA: Diagnosis not present

## 2021-02-13 DIAGNOSIS — R7303 Prediabetes: Secondary | ICD-10-CM | POA: Diagnosis not present

## 2021-02-13 DIAGNOSIS — I10 Essential (primary) hypertension: Secondary | ICD-10-CM

## 2021-02-13 DIAGNOSIS — G4733 Obstructive sleep apnea (adult) (pediatric): Secondary | ICD-10-CM

## 2021-02-13 DIAGNOSIS — F3281 Premenstrual dysphoric disorder: Secondary | ICD-10-CM

## 2021-02-13 DIAGNOSIS — D500 Iron deficiency anemia secondary to blood loss (chronic): Secondary | ICD-10-CM

## 2021-02-13 DIAGNOSIS — J4520 Mild intermittent asthma, uncomplicated: Secondary | ICD-10-CM | POA: Diagnosis not present

## 2021-02-13 DIAGNOSIS — Z1231 Encounter for screening mammogram for malignant neoplasm of breast: Secondary | ICD-10-CM

## 2021-02-13 DIAGNOSIS — N920 Excessive and frequent menstruation with regular cycle: Secondary | ICD-10-CM

## 2021-02-13 DIAGNOSIS — E282 Polycystic ovarian syndrome: Secondary | ICD-10-CM

## 2021-02-13 DIAGNOSIS — R809 Proteinuria, unspecified: Secondary | ICD-10-CM | POA: Insufficient documentation

## 2021-02-13 DIAGNOSIS — R808 Other proteinuria: Secondary | ICD-10-CM

## 2021-02-13 DIAGNOSIS — E559 Vitamin D deficiency, unspecified: Secondary | ICD-10-CM

## 2021-02-13 DIAGNOSIS — J301 Allergic rhinitis due to pollen: Secondary | ICD-10-CM

## 2021-02-13 LAB — MICROALBUMIN, URINE WAIVED
Creatinine, Urine Waived: 200 mg/dL (ref 10–300)
Microalb, Ur Waived: 80 mg/L — ABNORMAL HIGH (ref 0–19)
Microalb/Creat Ratio: <30 mg/g (ref <30)

## 2021-02-13 LAB — BAYER DCA HB A1C WAIVED: HB A1C (BAYER DCA - WAIVED): 6.2 % — ABNORMAL HIGH (ref 4.8–5.6)

## 2021-02-13 MED ORDER — VALSARTAN 40 MG PO TABS: 40.0000 mg | ORAL_TABLET | Freq: Every day | ORAL | 4 refills | Status: DC

## 2021-02-13 MED ORDER — OZEMPIC (0.25 OR 0.5 MG/DOSE) 2 MG/1.5ML ~~LOC~~ SOPN: PEN_INJECTOR | SUBCUTANEOUS | 4 refills | Status: AC

## 2021-02-13 MED ORDER — FLUOXETINE HCL 40 MG PO CAPS: 40.0000 mg | ORAL_CAPSULE | Freq: Every day | ORAL | 4 refills | Status: DC

## 2021-02-13 MED ORDER — MONTELUKAST SODIUM 10 MG PO TABS: ORAL_TABLET | ORAL | 4 refills | Status: AC

## 2021-02-13 MED ORDER — FLUOXETINE HCL 20 MG PO CAPS: ORAL_CAPSULE | ORAL | 4 refills | Status: DC

## 2021-02-13 NOTE — Assessment & Plan Note (Signed)
Chronic, exacerbated with work issues at this time.  Will increase Prozac to 20 MG daily two weeks of the month and then a week before menstrual cycle and week of cycle increase to 40 MG daily.  Discussed with patient.  She did not benefit from Celexa or Buspar in past.  If ongoing, could consider change to Paxil or trial of an SNRI, but would need to monitor BP with this.  Return in 4 weeks.

## 2021-02-13 NOTE — Assessment & Plan Note (Signed)
Chronic, stable, no current supplement.  Check CBC, iron/ferritin, and B12 level today.  Start supplement as needed.  Continue to collaborate with GYN.

## 2021-02-13 NOTE — Assessment & Plan Note (Signed)
Has IUD and attends GYN for pap, scheduled upcoming, continue this collaboration.

## 2021-02-13 NOTE — Assessment & Plan Note (Addendum)
Chronic, ongoing.  Continue Wixela daily and Albuterol as needed + Singulair/Flonase/Allegra.  Recommend modest weight loss.  Return for any exacerbation symptoms and in 6 months for follow-up, refills as needed.  CBC today.

## 2021-02-13 NOTE — Assessment & Plan Note (Signed)
BMI 46.01 with prediabetes and HTN.  Will start Ozempic 0.25 MG weekly x 4 weeks and then increase to 0.5 MG weekly, educated patient on this medication and she wishes to start.  Recommended eating smaller high protein, low fat meals more frequently and exercising 30 mins a day 5 times a week with a goal of 10-15lb weight loss in the next 3 months. Patient voiced their understanding and motivation to adhere to these recommendations.

## 2021-02-13 NOTE — Assessment & Plan Note (Signed)
Currently using CPAP 100% of the time, recommend to continue this.

## 2021-02-13 NOTE — Assessment & Plan Note (Signed)
With HTN. ALB 80 today.  Start Valsartan and recheck at future visit.  Educated her on this finding.

## 2021-02-13 NOTE — Assessment & Plan Note (Signed)
Chronic, stable.  Continue current medication regimen and adjust as needed.  Refills as needed. 

## 2021-02-13 NOTE — Patient Instructions (Signed)
[facility]  Address: [address], [place], [place] [address]  Phone: [phone]  Mammogram A mammogram is an X-ray of the breasts. This is done to check for changes that are not normal. This test can look for changes that may be caused by breast cancer or other problems. Mammograms are regularly done on women beginning at age 43. A man may have a mammogram if he has a lump or swelling in his breast. Tell a doctor: About any allergies you have. If you have breast implants. If you have had breast disease, biopsy, or surgery. If you have a family history of breast cancer. If you are breastfeeding. Whether you are pregnant or may be pregnant. What are the risks? Generally, this is a safe procedure. But problems may occur, including: Being exposed to radiation. Radiation levels are very low with this test. The need for more tests. The results were not read properly. Trouble finding breast cancer in women with dense breasts. What happens before the test? Have this test done about 1-2 weeks after your menstrual period. This is often when your breasts are the least tender. If you are visiting a new doctor or clinic, have any past mammogram images sent to your new doctor's office. Wash your breasts and under your arms on the day of the test. Do not use deodorants, perfumes, lotions, or powders on the day of the test. Take off any jewelry from your neck. Wear clothes that you can change into and out of easily. What happens during the test?  You will take off your clothes from the waist up. You will put on a gown. You will stand in front of the X-ray machine. Each breast will be placed between two plastic or glass plates. The plates will press down on your breast for a few seconds. Try to relax. This does not cause any harm to your breasts. It may not feel comfortable, but it will be very brief. X-rays will be taken from different angles of each  breast. The procedure may vary among doctors and hospitals. What can I expect after the test? The mammogram will be read by a specialist (radiologist). You may need to do parts of the test again. This depends on the quality of the images. You may go back to your normal activities. It is up to you to get the results of your test. Ask how to get your results when they are ready. Summary A mammogram is an X-ray of the breasts. It looks for changes that may be caused by breast cancer or other problems. A man may have this test if he has a lump or swelling in his breast. Before the test, tell your doctor about any breast problems that you have had in the past. Have this test done about 1-2 weeks after your menstrual period. Ask when your test results will be ready. Make sure you get your test results. This information is not intended to replace advice given to you by your health care provider. Make sure you discuss any questions you have with your health care provider. Document Revised: 03/21/2020 Document Reviewed: 03/21/2020 Elsevier Patient Education  2022 [employer]   

## 2021-02-13 NOTE — Assessment & Plan Note (Addendum)
Chronic, ongoing with BP above goal today in office and at home.  Significant family history of similar.  Recommend modest weight loss.  At this time will start Valsartan 40 MG daily and adjust as needed, avoid ACE due to asthma.  Educated her on ARB family of medication.  Her urine ALB today is 80 and would benefit from ARB.  Recommend she monitor BP at least a few mornings a week at home and document.  DASH diet at home.  Labs today: CBC, CMP, TSH.  Return in 4 weeks.

## 2021-02-13 NOTE — Assessment & Plan Note (Signed)
Ongoing, A1c today in prediabetic range at 6.2%.  Will send in Ozempic to start for both weight loss and prediabetes, will provide PA if needed for this.  Educated patient on medication and side effects, she would like to try.

## 2021-02-13 NOTE — Assessment & Plan Note (Addendum)
A1c 6.2% today with PCOS and HTN.  Will send in Ozempic to start for both weight loss and prediabetes, will provide PA if needed for this.  Educated patient on medication and side effects, she would like to try.  Return in 4 weeks.

## 2021-02-13 NOTE — Progress Notes (Signed)
BP (!) 140/97   Pulse 92   Temp 98.8 F (37.1 C) (Oral)   Ht 5' 3.3" (1.608 m)   Wt 262 lb 3.2 oz (118.9 kg)   SpO2 98%   BMI 46.01 kg/m    Subjective:    [name], female    DOB: [birth year], 45 y.o.   MRN: [id number]  HPI: Sydney White is a 45 y.o. female presenting on 02/13/2021 for comprehensive medical examination. Current medical complaints include:none  She currently lives with: husband Menopausal Symptoms: no  History of iron deficient anemia, no current supplement.  HYPERTENSION No current medications.  Her sisters and mom take medication.  Uses CPAP nightly.   Hypertension status: uncontrolled  Satisfied with current treatment? no Duration of hypertension: months BP monitoring frequency:  daily BP range: 175/102, 140/90 on average Aspirin: no Recurrent headaches: no Visual changes: no Palpitations: no Dyspnea: no Chest pain: no Lower extremity edema: no Dizzy/lightheaded: no   ASTHMA Continues Wixela and Albuterol.    Has CPAP and is using this regularly since November.   Asthma status: stable Satisfied with current treatment?: yes Albuterol/rescue inhaler frequency: not used in long time Dyspnea frequency: none Wheezing frequency: none  Cough frequency: improved with Wixela Nocturnal symptom frequency: none Limitation of activity: no Current upper respiratory symptoms: no Triggers: seasonal Home peak flows: none Last Spirometry: unknown Failed/intolerant to following asthma meds: none Asthma meds in past: unknown Aerochamber/spacer use: no Visits to ER or Urgent Care in past year: no Pneumovax: Not up to Date Influenza: refused  DEPRESSION Currently taking Prozac 20 MG, does endorse during cycle notices more irritation lately.  Has IUD in place.  Has had some recent hot flashes. Mood status: exacerbated Satisfied with current treatment?: yes Symptom severity: moderate  Duration of current treatment : chronic Side  effects: no Medication compliance: good compliance Psychotherapy/counseling: in past Depressed mood: yes Anxious mood: yes Anhedonia: no Significant weight loss or gain: no Insomnia: yes hard to stay asleep Fatigue: no Feelings of worthlessness or guilt: no Impaired concentration/indecisiveness: no Suicidal ideations: no Hopelessness: no Crying spells: yes Depression screen [facility] 2/9 02/13/2021 10/09/2019 04/10/2019 02/06/2019 04/30/2018  Decreased Interest '3 1 1 3 '$ 0  Down, Depressed, Hopeless '3 1 1 '$ - 1  PHQ - 2 Score '6 2 2 3 1  '$ Altered sleeping '1 2 1 1 '$ 0  Tired, decreased energy 0 '3 1 3 1  '$ Change in appetite '1 3 1 3 2  '$ Feeling bad or failure about yourself  1 1 0 1 1  Trouble concentrating 1 1 0 1 0  Moving slowly or fidgety/restless 0 0 0 0 0  Suicidal thoughts 1 1 0 0 1  PHQ-9 Score '11 13 5 12 6  '$ Difficult doing work/chores Somewhat difficult Somewhat difficult Not difficult at all Somewhat difficult Not difficult at all    The patient does not have a history of falls. I did not complete a risk assessment for falls. A plan of care for falls was not documented.   Past Medical History:  Past Medical History:  Diagnosis Date   Allergy    Anxiety    Asthma    GERD (gastroesophageal reflux disease)    Hypertension    PCOS (polycystic ovarian syndrome)    PMDD (premenstrual dysphoric disorder)     Surgical History:  Past Surgical History:  Procedure Laterality Date   CESAREAN SECTION  01/04/2013   DENTAL SURGERY      Medications:  Current Outpatient  Medications on File Prior to Visit  Medication Sig   albuterol (VENTOLIN HFA) 108 (90 Base) MCG/ACT inhaler Inhale 2 puffs into the lungs every 6 (six) hours as needed for wheezing or shortness of breath.   cyclobenzaprine (FLEXERIL) 10 MG tablet Take 1 tablet (10 mg total) by mouth at bedtime. Take only if needed.   fexofenadine (ALLEGRA) 180 MG tablet Take 180 mg by mouth daily.   Fluticasone-Salmeterol (WIXELA INHUB) 100-50  MCG/DOSE AEPB Inhale 1 puff into the lungs in the morning and at bedtime.   IBUPROFEN PO Take by mouth as needed.   omeprazole (PRILOSEC) 20 MG capsule Take 1 capsule (20 mg total) by mouth daily.   No current facility-administered medications on file prior to visit.    Allergies:  Allergies  Allergen Reactions   Sulfa Antibiotics Hives    Social History:  Social History   Socioeconomic History   Marital status: Married    Spouse name: Not on file   Number of children: Not on file   Years of education: Not on file   Highest education level: Not on file  Occupational History   Not on file  Tobacco Use   Smoking status: Never   Smokeless tobacco: Never  Vaping Use   Vaping Use: Never used  Substance and Sexual Activity   Alcohol use: Yes    Comment: socially   Drug use: No   Sexual activity: Yes    Birth control/protection: I.U.D.  Other Topics Concern   Not on file  Social History Narrative   Not on file   Social Determinants of Health   Financial Resource Strain: Low Risk    Difficulty of Paying Living Expenses: Not hard at all  Food Insecurity: No Food Insecurity   Worried About Charity fundraiser in the Last Year: Never true   [place] in the Last Year: Never true  Transportation Needs: No Transportation Needs   Lack of Transportation (Medical): No   Lack of Transportation (Non-Medical): No  Physical Activity: Insufficiently Active   Days of Exercise per Week: 3 days   Minutes of Exercise per Session: 30 min  Stress: No Stress Concern Present   Feeling of Stress : Not at all  Social Connections: Moderately Integrated   Frequency of Communication with Friends and Family: More than three times a week   Frequency of Social Gatherings with Friends and Family: More than three times a week   Attends Religious Services: More than 4 times per year   Active Member of [employer] or Organizations: No   Attends Music therapist: Never   Marital  Status: Married  Human resources officer Violence: Not At Risk   Fear of Current or Ex-Partner: No   Emotionally Abused: No   Physically Abused: No   Sexually Abused: No   Social History   Tobacco Use  Smoking Status Never  Smokeless Tobacco Never   Social History   Substance and Sexual Activity  Alcohol Use Yes   Comment: socially    Family History:  Family History  Problem Relation Age of Onset   Hypertension Mother    Arthritis Mother    Asthma Mother    Hypertension Sister    Asthma Sister    Arthritis Sister    Polycystic ovary syndrome Sister    Hypertension Maternal Grandmother    Kidney disease Maternal Grandmother    GER disease Maternal Grandmother    Heart disease Maternal Grandmother    COPD  Maternal Grandmother    Alcohol abuse Maternal Grandfather    Emphysema Maternal Grandfather    Arthritis Sister    Asthma Sister    Polycystic ovary syndrome Sister     Past medical history, surgical history, medications, allergies, family history and social history reviewed with patient today and changes made to appropriate areas of the chart.   ROS All other ROS negative except what is listed above and in the HPI.      Objective:    BP (!) 140/97   Pulse 92   Temp 98.8 F (37.1 C) (Oral)   Ht 5' 3.3" (1.608 m)   Wt 262 lb 3.2 oz (118.9 kg)   SpO2 98%   BMI 46.01 kg/m   Wt Readings from Last 3 Encounters:  02/13/21 262 lb 3.2 oz (118.9 kg)  07/26/20 264 lb 12.8 oz (120.1 kg)  10/09/19 261 lb 3.2 oz (118.5 kg)    Physical Exam Vitals and nursing note reviewed.  Constitutional:      General: She is awake. She is not in acute distress.    Appearance: She is well-developed. She is not ill-appearing.  HENT:     Head: Normocephalic and atraumatic.     Right Ear: Hearing, tympanic membrane, ear canal and external ear normal. No drainage.     Left Ear: Hearing, tympanic membrane, ear canal and external ear normal. No drainage.     Nose: Nose normal.      Right Sinus: No maxillary sinus tenderness or frontal sinus tenderness.     Left Sinus: No maxillary sinus tenderness or frontal sinus tenderness.     Mouth/Throat:     Mouth: Mucous membranes are moist.     Pharynx: Oropharynx is clear. Uvula midline. No pharyngeal swelling, oropharyngeal exudate or posterior oropharyngeal erythema.  Eyes:     General: Lids are normal.        Right eye: No discharge.        Left eye: No discharge.     Extraocular Movements: Extraocular movements intact.     Conjunctiva/sclera: Conjunctivae normal.     Pupils: Pupils are equal, round, and reactive to light.     Visual Fields: Right eye visual fields normal and left eye visual fields normal.  Neck:     Thyroid: No thyromegaly.     Vascular: No carotid bruit.     Trachea: Trachea normal.  Cardiovascular:     Rate and Rhythm: Normal rate and regular rhythm.     Heart sounds: Normal heart sounds. No murmur heard.   No gallop.  Pulmonary:     Effort: Pulmonary effort is normal. No accessory muscle usage or respiratory distress.     Breath sounds: Normal breath sounds.  Abdominal:     General: Bowel sounds are normal.     Palpations: Abdomen is soft. There is no hepatomegaly or splenomegaly.     Tenderness: There is no abdominal tenderness.  Musculoskeletal:        General: Normal range of motion.     Cervical back: Normal range of motion and neck supple.     Right lower leg: No edema.     Left lower leg: No edema.  Lymphadenopathy:     Head:     Right side of head: No submental, submandibular, tonsillar, preauricular or posterior auricular adenopathy.     Left side of head: No submental, submandibular, tonsillar, preauricular or posterior auricular adenopathy.     Cervical: No cervical adenopathy.  Skin:  General: Skin is warm and dry.     Capillary Refill: Capillary refill takes less than 2 seconds.     Findings: No rash.  Neurological:     Mental Status: She is alert and oriented to person,  place, and time.     Cranial Nerves: Cranial nerves are intact.     Gait: Gait is intact.     Deep Tendon Reflexes: Reflexes are normal and symmetric.     Reflex Scores:      Brachioradialis reflexes are 2+ on the right side and 2+ on the left side.      Patellar reflexes are 2+ on the right side and 2+ on the left side. Psychiatric:        Attention and Perception: Attention normal.        Mood and Affect: Mood normal.        Speech: Speech normal.        Behavior: Behavior normal. Behavior is cooperative.        Thought Content: Thought content normal.        Judgment: Judgment normal.    Results for orders placed or performed in visit on 02/13/21  Bayer DCA Hb A1c Waived  Result Value Ref Range   HB A1C (BAYER DCA - WAIVED) 6.2 (H) 4.8 - 5.6 %  Microalbumin, Urine Waived  Result Value Ref Range   Microalb, Ur Waived 80 (H) 0 - 19 mg/L   Creatinine, Urine Waived 200 10 - 300 mg/dL   Microalb/Creat Ratio <30 <30 mg/g      Assessment & Plan:   Problem List Items Addressed This Visit       Cardiovascular and Mediastinum   Essential hypertension    Chronic, ongoing with BP above goal today in office and at home.  Significant family history of similar.  Recommend modest weight loss.  At this time will start Valsartan 40 MG daily and adjust as needed, avoid ACE due to asthma.  Educated her on ARB family of medication.  Her urine ALB today is 80 and would benefit from ARB.  Recommend she monitor BP at least a few mornings a week at home and document.  DASH diet at home.  Labs today: CBC, CMP, TSH.  Return in 4 weeks.       Relevant Medications   valsartan (DIOVAN) 40 MG tablet   Other Relevant Orders   Microalbumin, Urine Waived   Comprehensive metabolic panel   CBC with Differential/Platelet   Lipid Panel w/o Chol/HDL Ratio   TSH     Respiratory   Asthma    Chronic, ongoing.  Continue Wixela daily and Albuterol as needed + Singulair/Flonase/Allegra.  Recommend modest  weight loss.  Return for any exacerbation symptoms and in 6 months for follow-up, refills as needed.  CBC today.      Relevant Medications   montelukast (SINGULAIR) 10 MG tablet   Allergic rhinitis    Chronic, stable.  Continue current medication regimen and adjust as needed.  Refills as needed.      OSA (obstructive sleep apnea)    Currently using CPAP 100% of the time, recommend to continue this.        Endocrine   PCOS (polycystic ovarian syndrome)    Ongoing, A1c today in prediabetic range at 6.2%.  Will send in Ozempic to start for both weight loss and prediabetes, will provide PA if needed for this.  Educated patient on medication and side effects, she would like to try.  Relevant Orders   Bayer DCA Hb A1c Waived (Completed)     Other   PMDD (premenstrual dysphoric disorder)    Chronic, exacerbated with work issues at this time.  Will increase Prozac to 20 MG daily two weeks of the month and then a week before menstrual cycle and week of cycle increase to 40 MG daily.  Discussed with patient.  She did not benefit from Celexa or Buspar in past.  If ongoing, could consider change to Paxil or trial of an SNRI, but would need to monitor BP with this.  Return in 4 weeks.      Relevant Medications   FLUoxetine (PROZAC) 20 MG capsule   Iron deficiency anemia due to chronic blood loss    Chronic, stable, no current supplement.  Check CBC, iron/ferritin, and B12 level today.  Start supplement as needed.  Continue to collaborate with GYN.      Relevant Orders   CBC with Differential/Platelet   Iron, TIBC and Ferritin Panel   Vitamin B12   Menorrhagia with regular cycle    Has IUD and attends GYN for pap, scheduled upcoming, continue this collaboration.      Morbid obesity ([place]) - Primary    BMI 46.01 with prediabetes and HTN.  Will start Ozempic 0.25 MG weekly x 4 weeks and then increase to 0.5 MG weekly, educated patient on this medication and she wishes to start.   Recommended eating smaller high protein, low fat meals more frequently and exercising 30 mins a day 5 times a week with a goal of 10-15lb weight loss in the next 3 months. Patient voiced their understanding and motivation to adhere to these recommendations.       Relevant Medications   Semaglutide,0.25 or 0.'5MG'$ /DOS, (OZEMPIC, 0.25 OR 0.5 MG/DOSE,) 2 MG/1.5ML SOPN   Prediabetes    A1c 6.2% today with PCOS and HTN.  Will send in Ozempic to start for both weight loss and prediabetes, will provide PA if needed for this.  Educated patient on medication and side effects, she would like to try.  Return in 4 weeks.      Relevant Orders   Bayer DCA Hb A1c Waived (Completed)   Microalbumin, Urine Waived   Microalbumin, Urine Waived (Completed)   Proteinuria    With HTN. ALB 80 today.  Start Valsartan and recheck at future visit.  Educated her on this finding.      Other Visit Diagnoses     Vitamin D deficiency       History of low levels, check today and start supplement as needed.   Relevant Orders   VITAMIN D 25 Hydroxy (Vit-D Deficiency, Fractures)   Need for tetanus, diphtheria, and acellular pertussis (Tdap) vaccine       Tdap today   Relevant Orders   Tdap vaccine greater than or equal to 7yo IM (Completed)   Need for influenza vaccination       Flu vaccine today   Relevant Orders   Flu Vaccine QUAD 35moIM (Fluarix, Fluzone & Alfiuria Quad PF) (Completed)   Encounter for screening mammogram for malignant neoplasm of breast       Mammogram ordered   Relevant Orders   MM 3D SCREEN BREAST BILATERAL   Annual physical exam       Annual physical today with labs and health maintenance reviewed -- PPSV23 next visit.        Follow up plan: Return in about 4 weeks (around 03/13/2021) for Weight Loss and HTN.  LABORATORY TESTING:  - Pap smear:  is scheduled with GYN  IMMUNIZATIONS:   - Tdap: Tetanus vaccination status reviewed: Td vaccination indicated and given today. -  Influenza: Administered today - Pneumovax: Refused - Prevnar: Refused - HPV: Not applicable - Zostavax vaccine: Not applicable  SCREENING: -Mammogram: Ordered today  - Colonoscopy: Not applicable  - Bone Density: Not applicable  -Hearing Test: Not applicable  -Spirometry: Not applicable   PATIENT COUNSELING:   Advised to take 1 mg of folate supplement per day if capable of pregnancy.   Sexuality: Discussed sexually transmitted diseases, partner selection, use of condoms, avoidance of unintended pregnancy  and contraceptive alternatives.   Advised to avoid cigarette smoking.  I discussed with the patient that most people either abstain from alcohol or drink within safe limits (<=14/week and <=4 drinks/occasion for males, <=7/weeks and <= 3 drinks/occasion for females) and that the risk for alcohol disorders and other health effects rises proportionally with the number of drinks per week and how often a drinker exceeds daily limits.  Discussed cessation/primary prevention of drug use and availability of treatment for abuse.   Diet: Encouraged to adjust caloric intake to maintain  or achieve ideal body weight, to reduce intake of dietary saturated fat and total fat, to limit sodium intake by avoiding high sodium foods and not adding table salt, and to maintain adequate dietary potassium and calcium preferably from fresh fruits, vegetables, and low-fat dairy products.    Stressed the importance of regular exercise  Injury prevention: Discussed safety belts, safety helmets, smoke detector, smoking near bedding or upholstery.   Dental health: Discussed importance of regular tooth brushing, flossing, and dental visits.    NEXT PREVENTATIVE PHYSICAL DUE IN 1 YEAR. Return in about 4 weeks (around 03/13/2021) for Weight Loss and HTN.

## 2021-02-14 ENCOUNTER — Telehealth: Payer: Self-pay

## 2021-02-14 ENCOUNTER — Encounter: Payer: Self-pay | Admitting: Nurse Practitioner

## 2021-02-14 DIAGNOSIS — E538 Deficiency of other specified B group vitamins: Secondary | ICD-10-CM | POA: Insufficient documentation

## 2021-02-14 DIAGNOSIS — E559 Vitamin D deficiency, unspecified: Secondary | ICD-10-CM | POA: Insufficient documentation

## 2021-02-14 LAB — COMPREHENSIVE METABOLIC PANEL
ALT: 73 IU/L — ABNORMAL HIGH (ref 0–32)
AST: 37 IU/L (ref 0–40)
Albumin/Globulin Ratio: 1.6 (ref 1.2–2.2)
Albumin: 4.7 g/dL (ref 3.8–4.8)
Alkaline Phosphatase: 101 IU/L (ref 44–121)
BUN/Creatinine Ratio: 10 (ref 9–23)
BUN: 8 mg/dL (ref 6–24)
Bilirubin Total: 0.4 mg/dL (ref 0.0–1.2)
CO2: 20 mmol/L (ref 20–29)
Calcium: 9.1 mg/dL (ref 8.7–10.2)
Chloride: 99 mmol/L (ref 96–106)
Creatinine, Ser: 0.77 mg/dL (ref 0.57–1.00)
Globulin, Total: 2.9 g/dL (ref 1.5–4.5)
Glucose: 86 mg/dL (ref 65–99)
Potassium: 3.7 mmol/L (ref 3.5–5.2)
Sodium: 137 mmol/L (ref 134–144)
Total Protein: 7.6 g/dL (ref 6.0–8.5)
eGFR: 98 mL/min/{1.73_m2} (ref >59)

## 2021-02-14 LAB — CBC WITH DIFFERENTIAL/PLATELET
Basophils Absolute: 0.1 10*3/uL (ref 0.0–0.2)
Basos: 1 %
EOS (ABSOLUTE): 0.1 10*3/uL (ref 0.0–0.4)
Eos: 2 %
Hematocrit: 37.6 % (ref 34.0–46.6)
Hemoglobin: 11.7 g/dL (ref 11.1–15.9)
Immature Grans (Abs): 0 10*3/uL (ref 0.0–0.1)
Immature Granulocytes: 0 %
Lymphocytes Absolute: 2.6 10*3/uL (ref 0.7–3.1)
Lymphs: 28 %
MCH: 25.2 pg — ABNORMAL LOW (ref 26.6–33.0)
MCHC: 31.1 g/dL — ABNORMAL LOW (ref 31.5–35.7)
MCV: 81 fL (ref 79–97)
Monocytes Absolute: 0.5 10*3/uL (ref 0.1–0.9)
Monocytes: 6 %
Neutrophils Absolute: 5.9 10*3/uL (ref 1.4–7.0)
Neutrophils: 63 %
Platelets: 402 10*3/uL (ref 150–450)
RBC: 4.64 x10E6/uL (ref 3.77–5.28)
RDW: 14.7 % (ref 11.7–15.4)
WBC: 9.3 10*3/uL (ref 3.4–10.8)

## 2021-02-14 LAB — IRON,TIBC AND FERRITIN PANEL
Ferritin: 25 ng/mL (ref 15–150)
Iron Saturation: 7 % — CL (ref 15–55)
Iron: 30 ug/dL (ref 27–159)
Total Iron Binding Capacity: 425 ug/dL (ref 250–450)
UIBC: 395 ug/dL (ref 131–425)

## 2021-02-14 LAB — TSH: TSH: 2.25 u[IU]/mL (ref 0.450–4.500)

## 2021-02-14 LAB — LIPID PANEL W/O CHOL/HDL RATIO
Cholesterol, Total: 184 mg/dL (ref 100–199)
HDL: 62 mg/dL (ref >39)
LDL Chol Calc (NIH): 109 mg/dL — ABNORMAL HIGH (ref 0–99)
Triglycerides: 67 mg/dL (ref 0–149)
VLDL Cholesterol Cal: 13 mg/dL (ref 5–40)

## 2021-02-14 LAB — VITAMIN B12: Vitamin B-12: 417 pg/mL (ref 232–1245)

## 2021-02-14 LAB — VITAMIN D 25 HYDROXY (VIT D DEFICIENCY, FRACTURES): Vit D, 25-Hydroxy: 11.6 ng/mL — ABNORMAL LOW (ref 30.0–100.0)

## 2021-02-14 MED ORDER — CHOLECALCIFEROL 1.25 MG (50000 UT) PO TABS: 1.0000 | ORAL_TABLET | ORAL | 4 refills | Status: DC

## 2021-02-14 NOTE — Telephone Encounter (Signed)
-----   Message from [name], NP sent at 02/13/2021  4:21 PM EDT ----- May need PA for [facility]

## 2021-02-14 NOTE — Telephone Encounter (Signed)
Prior Authorization initiated for patient for Ozempic via CoverMyMeds. Per CoverMyMeds "Your PA has been resolved, no additional PA is required. For further inquires please contact number on the back of member prescription card." Spoke with patient and notified her of what her recommendations were per CoverMyMeds. Patient verbalized understanding and has no further questions.

## 2021-02-14 NOTE — Addendum Note (Signed)
Addended by: Marnee Guarneri T on: 02/14/2021 08:09 AM   Modules accepted: Orders

## 2021-02-14 NOTE — Progress Notes (Signed)
Contacted via MyChart   Good evening Sydney White, your labs have returned: - Kidney function, creatinine and eGFR, is normal.  Liver function, shows mild elevation in ALT, trended up from last visit, and a normal AST. This could be related to diet -- recommend continued focus on healthy food options.  Did you pick up Ozempic? - CBC shows normal hemoglobin and hematocrit, but iron level on low side of normal.  I do recommend you take iron supplement, recommend Vitron (this has Vitamin C to help iron absorb too), take on tablet daily.  B12 level on low side of normal, take B12 supplement 1000 MCG daily. - Vitamin D very low, I have sent in a Vitamin D supplement for you to start taking weekly. - Thyroid normal.   - Cholesterol level shows LDL above normal, continue diet focus.  Any questions? Keep being amazing!!  Thank you for allowing me to participate in your care.  I appreciate you. Kindest regards, Kynzlie Hilleary

## 2021-03-05 ENCOUNTER — Other Ambulatory Visit: Payer: Self-pay | Admitting: Nurse Practitioner

## 2021-03-15 ENCOUNTER — Other Ambulatory Visit: Payer: Self-pay | Admitting: Nurse Practitioner

## 2021-03-16 NOTE — Telephone Encounter (Signed)
Requested Prescriptions  Pending Prescriptions Disp Refills  . omeprazole (PRILOSEC) 20 MG capsule [Pharmacy Med Name: OMEPRAZOLE 20MG  CAPSULES] 90 capsule 0    Sig: TAKE 1 CAPSULE(20 MG) BY MOUTH DAILY     Gastroenterology: Proton Pump Inhibitors Passed - 03/15/2021  3:27 AM      Passed - Valid encounter within last 12 months    Recent Outpatient Visits          1 month ago Morbid obesity ([place])   [place], [place] T, NP   7 months ago Morbid obesity ([place])   [place], Pavo T, NP   11 months ago Mild intermittent asthma without complication   [facility] Kathrine Haddock, NP   1 year ago Morbid obesity ([place])   [place], [place] T, NP   1 year ago Mild intermittent asthma with acute exacerbation   [place], Barbaraann Faster, NP      Future Appointments            Tomorrow [name], NP [place], [facility]

## 2021-03-17 ENCOUNTER — Other Ambulatory Visit: Payer: Self-pay

## 2021-03-17 ENCOUNTER — Encounter: Payer: Self-pay | Admitting: Nurse Practitioner

## 2021-03-17 ENCOUNTER — Ambulatory Visit: Payer: BC Managed Care – PPO | Admitting: Nurse Practitioner

## 2021-03-17 DIAGNOSIS — G4733 Obstructive sleep apnea (adult) (pediatric): Secondary | ICD-10-CM | POA: Diagnosis not present

## 2021-03-17 DIAGNOSIS — I10 Essential (primary) hypertension: Secondary | ICD-10-CM | POA: Diagnosis not present

## 2021-03-17 NOTE — Patient Instructions (Signed)

## 2021-03-17 NOTE — Assessment & Plan Note (Signed)
Currently using CPAP 100% of the time, recommend to continue this.

## 2021-03-17 NOTE — Progress Notes (Signed)
BP 128/84 (BP Location: Left Arm, Patient Position: Sitting)   Pulse 90   Temp 98.4 F (36.9 C) (Oral)   Wt 249 lb 12.8 oz (113.3 kg)   SpO2 98%   BMI 43.83 kg/m    Subjective:    [name], female    DOB: 02/04/1976, 45 y.o.   MRN: [id number]  HPI: Sydney White is a 45 y.o. female  Chief Complaint  Patient presents with   Weight Loss    Patient states she has loss 14 pounds. Patient states the Ozempic is working well. Patient states the only side effect is if she eats some greasy, she notices some diarrhea.    Hypertension   HYPERTENSION Started Valsartan 40 MG daily on 02/13/21, tolerating well with occasional dizziness initially.  Her sisters and mom take medication.  Uses CPAP nightly.    Started on Ozempic on 03/15/21 for weight loss and reports overall tolerating well with occasional diarrhea + occasionally feels like lump in throat.  Recent A1c 6.2%.  Has lost almost 20 pounds. Notices reduction in her appetite.  She is currently at 0.5 MG dosing.  Reports improvement in back pain with weight loss. Hypertension status: uncontrolled  Satisfied with current treatment? no Duration of hypertension: months BP monitoring frequency:  daily BP range: 120-130/70-80 Aspirin: no Recurrent headaches: no Visual changes: no Palpitations: no Dyspnea: no Chest pain: no Lower extremity edema: no Dizzy/lightheaded: no   Relevant past medical, surgical, family and social history reviewed and updated as indicated. Interim medical history since our last visit reviewed. Allergies and medications reviewed and updated.  Review of Systems  Constitutional:  Negative for activity change, appetite change, diaphoresis, fatigue and fever.  Respiratory:  Negative for cough, chest tightness and shortness of breath.   Cardiovascular:  Negative for chest pain, palpitations and leg swelling.  Gastrointestinal: Negative.   Neurological: Negative.    Psychiatric/Behavioral: Negative.     Per HPI unless specifically indicated above     Objective:    BP 128/84 (BP Location: Left Arm, Patient Position: Sitting)   Pulse 90   Temp 98.4 F (36.9 C) (Oral)   Wt 249 lb 12.8 oz (113.3 kg)   SpO2 98%   BMI 43.83 kg/m   Wt Readings from Last 3 Encounters:  03/17/21 249 lb 12.8 oz (113.3 kg)  02/13/21 262 lb 3.2 oz (118.9 kg)  07/26/20 264 lb 12.8 oz (120.1 kg)    Physical Exam Vitals and nursing note reviewed.  Constitutional:      General: She is awake. She is not in acute distress.    Appearance: She is well-developed and well-groomed. She is obese. She is not ill-appearing or toxic-appearing.  HENT:     Head: Normocephalic.     Right Ear: Hearing normal.     Left Ear: Hearing normal.  Eyes:     General: Lids are normal.        Right eye: No discharge.        Left eye: No discharge.     Conjunctiva/sclera: Conjunctivae normal.     Pupils: Pupils are equal, round, and reactive to light.  Neck:     Thyroid: No thyromegaly.     Vascular: No carotid bruit.  Cardiovascular:     Rate and Rhythm: Normal rate and regular rhythm.     Heart sounds: Normal heart sounds. No murmur heard.   No gallop.  Pulmonary:     Effort: Pulmonary effort is normal. No accessory muscle  usage or respiratory distress.     Breath sounds: Normal breath sounds.  Abdominal:     General: Bowel sounds are normal.     Palpations: Abdomen is soft. There is no hepatomegaly or splenomegaly.  Musculoskeletal:     Cervical back: Normal range of motion and neck supple.     Right lower leg: No edema.     Left lower leg: No edema.  Lymphadenopathy:     Cervical: No cervical adenopathy.  Skin:    General: Skin is warm and dry.  Neurological:     Mental Status: She is alert and oriented to person, place, and time.  Psychiatric:        Attention and Perception: Attention normal.        Mood and Affect: Mood normal.        Speech: Speech normal.         Behavior: Behavior normal. Behavior is cooperative.        Thought Content: Thought content normal.    Results for orders placed or performed in visit on 02/13/21  Bayer DCA Hb A1c Waived  Result Value Ref Range   HB A1C (BAYER DCA - WAIVED) 6.2 (H) 4.8 - 5.6 %  Comprehensive metabolic panel  Result Value Ref Range   Glucose 86 65 - 99 mg/dL   BUN 8 6 - 24 mg/dL   Creatinine, Ser 0.77 0.57 - 1.00 mg/dL   eGFR 98 >59 mL/min/1.73   BUN/Creatinine Ratio 10 9 - 23   Sodium 137 134 - 144 mmol/L   Potassium 3.7 3.5 - 5.2 mmol/L   Chloride 99 96 - 106 mmol/L   CO2 20 20 - 29 mmol/L   Calcium 9.1 8.7 - 10.2 mg/dL   Total Protein 7.6 6.0 - 8.5 g/dL   Albumin 4.7 3.8 - 4.8 g/dL   Globulin, Total 2.9 1.5 - 4.5 g/dL   Albumin/Globulin Ratio 1.6 1.2 - 2.2   Bilirubin Total 0.4 0.0 - 1.2 mg/dL   Alkaline Phosphatase 101 44 - 121 IU/L   AST 37 0 - 40 IU/L   ALT 73 (H) 0 - 32 IU/L  CBC with Differential/Platelet  Result Value Ref Range   WBC 9.3 3.4 - 10.8 x10E3/uL   RBC 4.64 3.77 - 5.28 x10E6/uL   Hemoglobin 11.7 11.1 - 15.9 g/dL   Hematocrit 37.6 34.0 - 46.6 %   MCV 81 79 - 97 fL   MCH 25.2 (L) 26.6 - 33.0 pg   MCHC 31.1 (L) 31.5 - 35.7 g/dL   RDW 14.7 11.7 - 15.4 %   Platelets 402 150 - 450 x10E3/uL   Neutrophils 63 Not Estab. %   Lymphs 28 Not Estab. %   Monocytes 6 Not Estab. %   Eos 2 Not Estab. %   Basos 1 Not Estab. %   Neutrophils Absolute 5.9 1.4 - 7.0 x10E3/uL   Lymphocytes Absolute 2.6 0.7 - 3.1 x10E3/uL   Monocytes Absolute 0.5 0.1 - 0.9 x10E3/uL   EOS (ABSOLUTE) 0.1 0.0 - 0.4 x10E3/uL   Basophils Absolute 0.1 0.0 - 0.2 x10E3/uL   Immature Granulocytes 0 Not Estab. %   Immature Grans (Abs) 0.0 0.0 - 0.1 x10E3/uL  Lipid Panel w/o Chol/HDL Ratio  Result Value Ref Range   Cholesterol, Total 184 100 - 199 mg/dL   Triglycerides 67 0 - 149 mg/dL   HDL 62 >39 mg/dL   VLDL Cholesterol Cal 13 5 - 40 mg/dL   LDL Chol Calc (NIH) 109 (H)  0 - 99 mg/dL  TSH  Result Value Ref  Range   TSH 2.250 0.450 - 4.500 uIU/mL  VITAMIN D 25 Hydroxy (Vit-D Deficiency, Fractures)  Result Value Ref Range   Vit D, 25-Hydroxy 11.6 (L) 30.0 - 100.0 ng/mL  Iron, TIBC and Ferritin Panel  Result Value Ref Range   Total Iron Binding Capacity 425 250 - 450 ug/dL   UIBC 395 131 - 425 ug/dL   Iron 30 27 - 159 ug/dL   Iron Saturation 7 (LL) 15 - 55 %   Ferritin 25 15 - 150 ng/mL  Vitamin B12  Result Value Ref Range   Vitamin B-12 417 232 - 1,245 pg/mL  Microalbumin, Urine Waived  Result Value Ref Range   Microalb, Ur Waived 80 (H) 0 - 19 mg/L   Creatinine, Urine Waived 200 10 - 300 mg/dL   Microalb/Creat Ratio <30 <30 mg/g      Assessment & Plan:   Problem List Items Addressed This Visit       Cardiovascular and Mediastinum   Essential hypertension    Chronic, ongoing with BP improving with Valsartan on board.  Recommend modest weight loss.  At this time continue Valsartan 40 MG daily and adjust as needed, avoid ACE due to asthma.  Her urine ALB at physical was 80 and will benefit from ongoing ARB.  Recommend she monitor BP at least a few mornings a week at home and document.  DASH diet at home.  Labs today: BMP today.  Return in 8 weeks.       Relevant Orders   TSH   Basic metabolic panel     Respiratory   OSA (obstructive sleep apnea)    Currently using CPAP 100% of the time, recommend to continue this.        Other   Morbid obesity ([place]) - Primary    BMI 43.83 has lost almost 20 pounds with Ozempic on board and regular walking.  Continue this and check TSH today.  Recommended eating smaller high protein, low fat meals more frequently and exercising 30 mins a day 5 times a week with a goal of 10-15lb weight loss in the next 3 months. Patient voiced their understanding and motivation to adhere to these recommendations.         Follow up plan: Return in about 8 weeks (around 05/12/2021) for Weight check with Ozempic.

## 2021-03-17 NOTE — Assessment & Plan Note (Signed)
Chronic, ongoing with BP improving with Valsartan on board.  Recommend modest weight loss.  At this time continue Valsartan 40 MG daily and adjust as needed, avoid ACE due to asthma.  Her urine ALB at physical was 80 and will benefit from ongoing ARB.  Recommend she monitor BP at least a few mornings a week at home and document.  DASH diet at home.  Labs today: BMP today.  Return in 8 weeks.

## 2021-03-17 NOTE — Assessment & Plan Note (Signed)
BMI 43.83 has lost almost 20 pounds with Ozempic on board and regular walking.  Continue this and check TSH today.  Recommended eating smaller high protein, low fat meals more frequently and exercising 30 mins a day 5 times a week with a goal of 10-15lb weight loss in the next 3 months. Patient voiced their understanding and motivation to adhere to these recommendations.

## 2021-03-18 LAB — BASIC METABOLIC PANEL
BUN/Creatinine Ratio: 9 (ref 9–23)
BUN: 8 mg/dL (ref 6–24)
CO2: 19 mmol/L — ABNORMAL LOW (ref 20–29)
Calcium: 9.5 mg/dL (ref 8.7–10.2)
Chloride: 99 mmol/L (ref 96–106)
Creatinine, Ser: 0.88 mg/dL (ref 0.57–1.00)
Glucose: 94 mg/dL (ref 70–99)
Potassium: 3.7 mmol/L (ref 3.5–5.2)
Sodium: 136 mmol/L (ref 134–144)
eGFR: 84 mL/min/{1.73_m2} (ref >59)

## 2021-03-18 LAB — TSH: TSH: 1.33 u[IU]/mL (ref 0.450–4.500)

## 2021-03-19 NOTE — Progress Notes (Signed)
Contacted via [place] afternoon Sydney White, your labs have returned and all remains nice and stable.  No medication changes needed.  You are doing great.  Any questions? Keep being amazing!!  Thank you for allowing me to participate in your care.  I appreciate you. Kindest regards, Lisanne Ponce

## 2021-03-31 NOTE — Telephone Encounter (Signed)
None

## 2021-05-03 ENCOUNTER — Encounter: Payer: Self-pay | Admitting: Nurse Practitioner

## 2021-05-05 NOTE — Telephone Encounter (Signed)
[facility]rena rcvd/charged 04/07/2019

## 2021-05-05 NOTE — Telephone Encounter (Signed)
Spoke with pt and informed her on insurance.

## 2021-05-15 ENCOUNTER — Ambulatory Visit: Payer: Self-pay | Admitting: Nurse Practitioner

## 2021-05-30 MED ORDER — VALSARTAN 40 MG PO TABS: 40.0000 mg | ORAL_TABLET | Freq: Every day | ORAL | 4 refills | Status: DC

## 2021-06-07 MED ORDER — VALSARTAN 40 MG PO TABS: 40.0000 mg | ORAL_TABLET | Freq: Every day | ORAL | 4 refills | Status: DC

## 2021-06-07 NOTE — Addendum Note (Signed)
Addended by: Marnee Guarneri T on: 06/07/2021 10:53 AM   Modules accepted: Orders

## 2021-06-28 ENCOUNTER — Other Ambulatory Visit: Payer: Self-pay

## 2021-06-28 ENCOUNTER — Other Ambulatory Visit: Payer: Self-pay | Admitting: Nurse Practitioner

## 2021-06-28 MED ORDER — VALSARTAN 40 MG PO TABS: 40.0000 mg | ORAL_TABLET | Freq: Every day | ORAL | 4 refills | Status: AC

## 2022-05-10 ENCOUNTER — Other Ambulatory Visit: Payer: Self-pay | Admitting: Nurse Practitioner

## 2022-05-10 NOTE — Telephone Encounter (Signed)
Called patient to review medication request for prozac. Listed as historical provider. Patient has not been seen in practice x 1 year. Requesting to verify if patient has another PCP.

## 2022-05-10 NOTE — Telephone Encounter (Signed)
Attempted to contact patient regarding PCP. Historical provider prescribed medication . Provider not at this practice. Requested Prescriptions  Refused Prescriptions Disp Refills   FLUoxetine (PROZAC) 40 MG capsule [Pharmacy Med Name: FLUOXETINE '40MG'$  CAPSULES] 90 capsule     Sig: TAKE 1 CAPSULE(40 MG) BY MOUTH DAILY     Psychiatry:  Antidepressants - SSRI Failed - 05/10/2022  3:49 AM      Failed - Completed PHQ-2 or PHQ-9 in the last 360 days      Failed - Valid encounter within last 6 months    Recent Outpatient Visits           1 year ago Morbid obesity ([place])   [place], Henrine Screws T, NP   1 year ago Morbid obesity ([place])   [place], [place] T, NP   1 year ago Morbid obesity ([place])   [place], Calvin T, NP   2 years ago Mild intermittent asthma without complication   [place] Kathrine Haddock, NP   2 years ago Morbid obesity [facility])   [place], Barbaraann Faster, NP
